# Patient Record
Sex: Male | Born: 1965 | Race: White | Hispanic: No | Marital: Married | Smoking: Current every day smoker
Health system: Southern US, Community
[De-identification: ages and names within clinical notes are randomized; demographics above are authoritative.]

## PROBLEM LIST (undated history)

## (undated) DIAGNOSIS — M1990 Unspecified osteoarthritis, unspecified site: Secondary | ICD-10-CM

## (undated) DIAGNOSIS — J45909 Unspecified asthma, uncomplicated: Secondary | ICD-10-CM

## (undated) DIAGNOSIS — G4730 Sleep apnea, unspecified: Secondary | ICD-10-CM

## (undated) DIAGNOSIS — I10 Essential (primary) hypertension: Secondary | ICD-10-CM

## (undated) DIAGNOSIS — M21371 Foot drop, right foot: Secondary | ICD-10-CM

## (undated) DIAGNOSIS — M5431 Sciatica, right side: Secondary | ICD-10-CM

## (undated) HISTORY — PX: MULTIPLE TOOTH EXTRACTIONS: SHX2053

## (undated) HISTORY — DX: Foot drop, right foot: M21.371

## (undated) HISTORY — PX: KNEE ARTHROSCOPY: SUR90

## (undated) HISTORY — PX: BACK SURGERY: SHX140

## (undated) HISTORY — DX: Sleep apnea, unspecified: G47.30

## (undated) HISTORY — PX: SPINE SURGERY: SHX786

## (undated) HISTORY — DX: Unspecified asthma, uncomplicated: J45.909

## (undated) HISTORY — DX: Essential (primary) hypertension: I10

## (undated) HISTORY — PX: ROTATOR CUFF REPAIR: SHX139

## (undated) HISTORY — PX: CERVICAL FUSION: SHX112

## (undated) HISTORY — PX: CARPAL TUNNEL RELEASE: SHX101

---

## 2016-01-20 ENCOUNTER — Ambulatory Visit (INDEPENDENT_AMBULATORY_CARE_PROVIDER_SITE_OTHER): Payer: Medicaid Other | Admitting: Family Medicine

## 2016-01-20 ENCOUNTER — Telehealth: Payer: Self-pay | Admitting: Family Medicine

## 2016-01-20 ENCOUNTER — Encounter: Payer: Self-pay | Admitting: Family Medicine

## 2016-01-20 VITALS — BP 152/91 | HR 94 | Temp 97.3°F | Ht 65.0 in | Wt 198.8 lb

## 2016-01-20 DIAGNOSIS — K219 Gastro-esophageal reflux disease without esophagitis: Secondary | ICD-10-CM | POA: Diagnosis not present

## 2016-01-20 DIAGNOSIS — G8929 Other chronic pain: Secondary | ICD-10-CM

## 2016-01-20 DIAGNOSIS — I10 Essential (primary) hypertension: Secondary | ICD-10-CM | POA: Diagnosis not present

## 2016-01-20 DIAGNOSIS — M545 Low back pain: Secondary | ICD-10-CM | POA: Diagnosis not present

## 2016-01-20 DIAGNOSIS — J45909 Unspecified asthma, uncomplicated: Secondary | ICD-10-CM | POA: Diagnosis not present

## 2016-01-20 DIAGNOSIS — G2581 Restless legs syndrome: Secondary | ICD-10-CM | POA: Diagnosis not present

## 2016-01-20 DIAGNOSIS — Z23 Encounter for immunization: Secondary | ICD-10-CM | POA: Diagnosis not present

## 2016-01-20 LAB — URINALYSIS, COMPLETE
Bilirubin, UA: NEGATIVE
GLUCOSE, UA: NEGATIVE
KETONES UA: NEGATIVE
Leukocytes, UA: NEGATIVE
NITRITE UA: NEGATIVE
PROTEIN UA: NEGATIVE
SPEC GRAV UA: 1.025 (ref 1.005–1.030)
Urobilinogen, Ur: 0.2 mg/dL (ref 0.2–1.0)
pH, UA: 6 (ref 5.0–7.5)

## 2016-01-20 LAB — MICROSCOPIC EXAMINATION
Bacteria, UA: NONE SEEN
Renal Epithel, UA: NONE SEEN /hpf

## 2016-01-20 MED ORDER — BUDESONIDE-FORMOTEROL FUMARATE 160-4.5 MCG/ACT IN AERO: 2.0000 | INHALATION_SPRAY | Freq: Two times a day (BID) | RESPIRATORY_TRACT | 3 refills | Status: DC

## 2016-01-20 MED ORDER — ROPINIROLE HCL 1 MG PO TABS: 1.0000 mg | ORAL_TABLET | Freq: Three times a day (TID) | ORAL | 2 refills | Status: DC

## 2016-01-20 MED ORDER — AMLODIPINE BESYLATE 5 MG PO TABS: 5.0000 mg | ORAL_TABLET | Freq: Every day | ORAL | 5 refills | Status: DC

## 2016-01-20 NOTE — Telephone Encounter (Signed)
Pharmacist need to know if RX should be for 90 days instead of 30  for rOPINIRole (REQUIP) 1 MG tablet - Take 1 tablet (1 mg total) by mouth 3 (three) times daily.  Also this is a new client.  Is this correct to send to [facility]?  Please call

## 2016-01-20 NOTE — Telephone Encounter (Signed)
Stu at [facility] has received all questions regarding this phone call

## 2016-01-20 NOTE — Progress Notes (Addendum)
Subjective:  Patient ID: Jay Ortega, male    DOB: [birth year]  Age: 50 y.o. MRN: [id number]  CC: New Patient (Initial Visit)   HPI Jay Ortega presents for  follow-up of hypertension. Patient has no history of headache chest pain or shortness of breath or recent cough. Patient also denies symptoms of TIA such as numbness weakness lateralizing. Patient denies side effects from his medication. States ran out several weeks ago. Patient also has asthma. He uses an inhaler periodically. Denies current symptoms including shortness of breath and wheezing.   He also has a history of back pain. Has had multiple back surgeries. Requests opiate prescription to tide him over until he can get to pain management.   History Cort has a past medical history of Asthma; Foot drop, right; Hypertension; and Sleep apnea.   He has a past surgical history that includes Spine surgery; Rotator cuff repair; Carpal tunnel release (Bilateral); and Knee arthroscopy (Left).   His family history includes Cancer in his father; Diabetes in his mother.He reports that he has been smoking Cigarettes.  He has never used smokeless tobacco. He reports that he does not drink alcohol or use drugs.    ROS Review of Systems  Constitutional: Negative for chills, diaphoresis, fever and unexpected weight change.  HENT: Negative for congestion, hearing loss, rhinorrhea and sore throat.   Eyes: Negative for visual disturbance.  Respiratory: Negative for cough and shortness of breath.   Cardiovascular: Negative for chest pain.  Gastrointestinal: Negative for abdominal pain, constipation and diarrhea.  Genitourinary: Negative for dysuria and flank pain.  Musculoskeletal: Positive for arthralgias and back pain. Negative for joint swelling.  Skin: Negative for rash.  Neurological: Negative for dizziness and headaches.  Psychiatric/Behavioral: Negative for dysphoric mood and sleep disturbance.    Objective:  BP (!) 152/91    Pulse 94   Temp 97.3 F (36.3 C) (Oral)   Ht 5' 5"  (1.651 m)   Wt 198 lb 12.8 oz (90.2 kg)   BMI 33.08 kg/m   BP Readings from Last 3 Encounters:  01/20/16 (!) 152/91    Wt Readings from Last 3 Encounters:  01/20/16 198 lb 12.8 oz (90.2 kg)     Physical Exam  Constitutional: He is oriented to person, place, and time. He appears well-developed and well-nourished. No distress.  HENT:  Head: Normocephalic and atraumatic.  Right Ear: External ear normal.  Left Ear: External ear normal.  Nose: Nose normal.  Mouth/Throat: Oropharynx is clear and moist.  Eyes: Conjunctivae and EOM are normal. Pupils are equal, round, and reactive to light.  Neck: Normal range of motion. Neck supple. No thyromegaly present.  Cardiovascular: Normal rate, regular rhythm and normal heart sounds.   No murmur heard. Pulmonary/Chest: Effort normal and breath sounds normal. No respiratory distress. He has no wheezes. He has no rales.  Abdominal: Soft. Bowel sounds are normal. He exhibits no distension. There is no tenderness.  Lymphadenopathy:    He has no cervical adenopathy.  Neurological: He is alert and oriented to person, place, and time. He has normal reflexes.  Skin: Skin is warm and dry.  Psychiatric: He has a normal mood and affect. His behavior is normal. Judgment and thought content normal.     Lab Results  Component Value Date   WBC 10.8 01/20/2016   HCT 48.4 01/20/2016   PLT 267 01/20/2016   GLUCOSE 96 01/20/2016   ALT 16 01/20/2016   AST 19 01/20/2016   NA 145 (H) 01/20/2016  K 4.9 01/20/2016   CL 102 01/20/2016   CREATININE 0.73 (L) 01/20/2016   BUN 12 01/20/2016   CO2 24 01/20/2016    Patient was never admitted.  Assessment & Plan:   Jay Ortega was seen today for new patient (initial visit).  Diagnoses and all orders for this visit:  Benign essential HTN -     CBC with Differential/Platelet -     CMP14+EGFR -     Urinalysis, Complete -     Ambulatory referral to Pain  Clinic -     amLODipine (NORVASC) 5 MG tablet; Take 1 tablet (5 mg total) by mouth daily. For blood pressure -     Microscopic Examination  Encounter for immunization -     Flu Vaccine QUAD 36+ mos IM  Chronic bilateral low back pain, with sciatica presence unspecified -     CBC with Differential/Platelet -     CMP14+EGFR -     Urinalysis, Complete -     Ambulatory referral to Pain Clinic  Moderate asthma without complication, unspecified whether persistent -     PR BREATHING CAPACITY TEST -     CBC with Differential/Platelet -     CMP14+EGFR -     Urinalysis, Complete -     Ambulatory referral to Pain Clinic -     budesonide-formoterol (SYMBICORT) 160-4.5 MCG/ACT inhaler; Inhale 2 puffs into the lungs 2 (two) times daily. For COPD/Asthma  Gastroesophageal reflux disease without esophagitis  Need for influenza vaccination -     Flu Vaccine QUAD 36+ mos IM  Restless leg syndrome -     rOPINIRole (REQUIP) 1 MG tablet; Take 1 tablet (1 mg total) by mouth 3 (three) times daily.    I am having Jay Ortega start on rOPINIRole, budesonide-formoterol, and amLODipine. I am also having him maintain his RANITIDINE HCL PO, ALBUTEROL IN, and albuterol.  Meds ordered this encounter  Medications  . RANITIDINE HCL PO    Sig: Take by mouth.  . ALBUTEROL IN    Sig: Inhale into the lungs.  Marland Kitchen albuterol (ACCUNEB) 1.25 MG/3ML nebulizer solution    Sig: Take 1 ampule by nebulization every 6 (six) hours as needed for wheezing.  Marland Kitchen rOPINIRole (REQUIP) 1 MG tablet    Sig: Take 1 tablet (1 mg total) by mouth 3 (three) times daily.    Dispense:  30 tablet    Refill:  2  . budesonide-formoterol (SYMBICORT) 160-4.5 MCG/ACT inhaler    Sig: Inhale 2 puffs into the lungs 2 (two) times daily. For COPD/Asthma    Dispense:  3 Inhaler    Refill:  3  . amLODipine (NORVASC) 5 MG tablet    Sig: Take 1 tablet (5 mg total) by mouth daily. For blood pressure    Dispense:  30 tablet    Refill:  5      Follow-up: Return in about 1 month (around 02/20/2016).  Claretta Fraise, M.D.

## 2016-01-21 LAB — CMP14+EGFR
ALBUMIN: 4.8 g/dL (ref 3.5–5.5)
ALT: 16 IU/L (ref 0–44)
AST: 19 IU/L (ref 0–40)
Albumin/Globulin Ratio: 1.8 (ref 1.2–2.2)
Alkaline Phosphatase: 73 IU/L (ref 39–117)
BUN / CREAT RATIO: 16 (ref 9–20)
BUN: 12 mg/dL (ref 6–24)
Bilirubin Total: 0.2 mg/dL (ref 0.0–1.2)
CALCIUM: 9.8 mg/dL (ref 8.7–10.2)
CO2: 24 mmol/L (ref 18–29)
CREATININE: 0.73 mg/dL — AB (ref 0.76–1.27)
Chloride: 102 mmol/L (ref 96–106)
GFR calc Af Amer: 123 mL/min/{1.73_m2} (ref >59)
GFR, EST NON AFRICAN AMERICAN: 107 mL/min/{1.73_m2} (ref >59)
GLOBULIN, TOTAL: 2.6 g/dL (ref 1.5–4.5)
GLUCOSE: 96 mg/dL (ref 65–99)
Potassium: 4.9 mmol/L (ref 3.5–5.2)
SODIUM: 145 mmol/L — AB (ref 134–144)
TOTAL PROTEIN: 7.4 g/dL (ref 6.0–8.5)

## 2016-01-21 LAB — CBC WITH DIFFERENTIAL/PLATELET
BASOS ABS: 0.1 10*3/uL (ref 0.0–0.2)
Basos: 1 %
EOS (ABSOLUTE): 0.5 10*3/uL — ABNORMAL HIGH (ref 0.0–0.4)
EOS: 5 %
HEMATOCRIT: 48.4 % (ref 37.5–51.0)
HEMOGLOBIN: 16.7 g/dL (ref 13.0–17.7)
IMMATURE GRANS (ABS): 0 10*3/uL (ref 0.0–0.1)
IMMATURE GRANULOCYTES: 0 %
LYMPHS ABS: 2.6 10*3/uL (ref 0.7–3.1)
LYMPHS: 24 %
MCH: 32.2 pg (ref 26.6–33.0)
MCHC: 34.5 g/dL (ref 31.5–35.7)
MCV: 93 fL (ref 79–97)
MONOCYTES: 10 %
Monocytes Absolute: 1 10*3/uL — ABNORMAL HIGH (ref 0.1–0.9)
NEUTROS PCT: 60 %
Neutrophils Absolute: 6.6 10*3/uL (ref 1.4–7.0)
Platelets: 267 10*3/uL (ref 150–379)
RBC: 5.19 x10E6/uL (ref 4.14–5.80)
RDW: 13.7 % (ref 12.3–15.4)
WBC: 10.8 10*3/uL (ref 3.4–10.8)

## 2016-01-22 ENCOUNTER — Encounter: Payer: Self-pay | Admitting: *Deleted

## 2016-01-22 DIAGNOSIS — J45909 Unspecified asthma, uncomplicated: Secondary | ICD-10-CM | POA: Insufficient documentation

## 2016-01-27 ENCOUNTER — Telehealth: Payer: Self-pay | Admitting: Family Medicine

## 2016-01-28 NOTE — Telephone Encounter (Signed)
We cant rush a pain  Referral   It has been made but could take up to 6 weeks

## 2016-01-28 NOTE — Telephone Encounter (Signed)
Pt given appt with Dr.Stacks 12/15 at 3:55.

## 2016-01-29 ENCOUNTER — Encounter (INDEPENDENT_AMBULATORY_CARE_PROVIDER_SITE_OTHER): Payer: Self-pay

## 2016-01-29 ENCOUNTER — Ambulatory Visit (INDEPENDENT_AMBULATORY_CARE_PROVIDER_SITE_OTHER): Payer: Medicaid Other | Admitting: Family Medicine

## 2016-01-29 ENCOUNTER — Encounter: Payer: Self-pay | Admitting: Family Medicine

## 2016-01-29 VITALS — BP 168/90 | HR 100 | Temp 97.4°F | Ht 65.0 in | Wt 200.0 lb

## 2016-01-29 DIAGNOSIS — G2581 Restless legs syndrome: Secondary | ICD-10-CM

## 2016-01-29 DIAGNOSIS — G8929 Other chronic pain: Secondary | ICD-10-CM

## 2016-01-29 DIAGNOSIS — M545 Low back pain: Secondary | ICD-10-CM | POA: Diagnosis not present

## 2016-01-29 DIAGNOSIS — F1120 Opioid dependence, uncomplicated: Secondary | ICD-10-CM

## 2016-01-29 MED ORDER — OXYCODONE HCL 10 MG PO TABS: 10.0000 mg | ORAL_TABLET | Freq: Three times a day (TID) | ORAL | 0 refills | Status: DC | PRN

## 2016-01-29 NOTE — Progress Notes (Signed)
Subjective:  Patient ID: Jay Ortega, male    DOB: 07/11/1965  Age: 50 y.o. MRN: [id number]  CC: Back Pain (pt here today for back pain, has referral for pain management but is in a lot of pain and can't wait until then, would like something for pain to hold him over)   HPI Jay Ortega presents for recheck of pain. Fell on ice 3 days ago.Exacerbating his pre-existing severe back pain. Of note is that the local pain clinic is now full and back up. They have not been able to give him exact time for an appointment within the next 6 weeks. He is in 8-10/10 pain at the midline lower back it is an intense ache.    History Jay Ortega has a past medical history of Asthma; Foot drop, right; Hypertension; and Sleep apnea.   He has a past surgical history that includes Spine surgery; Rotator cuff repair; Carpal tunnel release (Bilateral); and Knee arthroscopy (Left).   His family history includes Cancer in his father; Diabetes in his mother.He reports that he has been smoking Cigarettes.  He has never used smokeless tobacco. He reports that he does not drink alcohol or use drugs.    ROS Review of Systems  Constitutional: Negative for chills, diaphoresis and fever.  HENT: Negative for rhinorrhea and sore throat.   Respiratory: Negative for cough and shortness of breath.   Cardiovascular: Negative for chest pain.  Gastrointestinal: Negative for abdominal pain.  Musculoskeletal: Negative for arthralgias and myalgias.  Skin: Negative for rash.  Neurological: Negative for weakness and headaches.    Objective:  BP (!) 168/90 Comment: pt is in a lot of pain and had just walked back down the hal  Pulse 100   Temp 97.4 F (36.3 C) (Oral)   Ht 5\' 5"  (1.651 m)   Wt 200 lb (90.7 kg)   BMI 33.28 kg/m   BP Readings from Last 3 Encounters:  01/29/16 (!) 168/90  01/20/16 (!) 152/91    Wt Readings from Last 3 Encounters:  01/29/16 200 lb (90.7 kg)  01/20/16 198 lb 12.8 oz (90.2 kg)      Physical Exam  Constitutional: He is oriented to person, place, and time. He appears well-developed and well-nourished. He appears distressed.  HENT:  Head: Normocephalic and atraumatic.  Right Ear: Tympanic membrane and external ear normal. No decreased hearing is noted.  Left Ear: Tympanic membrane and external ear normal. No decreased hearing is noted.  Mouth/Throat: No oropharyngeal exudate or posterior oropharyngeal erythema.  Eyes: Pupils are equal, round, and reactive to light.  Neck: Normal range of motion. Neck supple.  Cardiovascular: Normal rate and regular rhythm.   No murmur heard. Pulmonary/Chest: Breath sounds normal. No respiratory distress.  Abdominal: Soft. Bowel sounds are normal. He exhibits no mass. There is no tenderness.  Musculoskeletal: He exhibits tenderness (midline lumbar).  Can not extend lumbar spine above 20 degrees. Cannot flex beyond 45  Neurological: He is alert and oriented to person, place, and time.  Skin: Skin is warm and dry.  Multiple midline lumbar surgical scars  Psychiatric: He has a normal mood and affect.  Vitals reviewed.    Lab Results  Component Value Date   WBC 10.8 01/20/2016   HCT 48.4 01/20/2016   PLT 267 01/20/2016   GLUCOSE 96 01/20/2016   ALT 16 01/20/2016   AST 19 01/20/2016   NA 145 (H) 01/20/2016   K 4.9 01/20/2016   CL 102 01/20/2016   CREATININE 0.73 (L)  01/20/2016   BUN 12 01/20/2016   CO2 24 01/20/2016    Patient was never admitted.  Assessment & Plan:   Jay Ortega was seen today for back pain.  Diagnoses and all orders for this visit:  Chronic bilateral low back pain, with sciatica presence unspecified  Uncomplicated opioid dependence (HCC) -     ToxASSURE Select 13 (MW), Urine  Restless leg syndrome  Other orders -     Oxycodone HCl 10 MG TABS; Take 1 tablet (10 mg total) by mouth 3 (three) times daily as needed (for severe pain only).   Allergies as of 01/29/2016      Reactions   Codeine  Shortness Of Breath   Hives   Other    Tomatoes cause fever blisters.      Medication List       Accurate as of 01/29/16  6:25 PM. Always use your most recent med list.          albuterol 1.25 MG/3ML nebulizer solution Commonly known as:  ACCUNEB Take 1 ampule by nebulization every 6 (six) hours as needed for wheezing.   ALBUTEROL IN Inhale into the lungs.   amLODipine 5 MG tablet Commonly known as:  NORVASC Take 1 tablet (5 mg total) by mouth daily. For blood pressure   budesonide-formoterol 160-4.5 MCG/ACT inhaler Commonly known as:  SYMBICORT Inhale 2 puffs into the lungs 2 (two) times daily. For COPD/Asthma   Oxycodone HCl 10 MG Tabs Take 1 tablet (10 mg total) by mouth 3 (three) times daily as needed (for severe pain only).   RANITIDINE HCL PO Take by mouth.   rOPINIRole 1 MG tablet Commonly known as:  REQUIP Take 1 tablet (1 mg total) by mouth 3 (three) times daily.      Due to the severity of this pain and the obvious surgical scars giving credence to his concerns about failed back, not to mention the known problem getting in the pain clinic I went ahead with a week's worth of oxycodone. He will take 10 mg 3 times a day. Should he pass his toxassure I will consider extending that until the time comes that he is except a pain clinic but no longer than 6-8 weeks at most. He has not been asked to sign a pain contract due to the short-term nature of this arrangement. Since [place] [facility] is not reassuring.  I am having Jay Ortega start on Oxycodone HCl. I am also having him maintain his RANITIDINE HCL PO, ALBUTEROL IN, albuterol, rOPINIRole, budesonide-formoterol, and amLODipine.  Meds ordered this encounter  Medications  . Oxycodone HCl 10 MG TABS    Sig: Take 1 tablet (10 mg total) by mouth 3 (three) times daily as needed (for severe pain only).    Dispense:  25  tablet    Refill:  0   MME 45  Follow-up: Return in about 1 week (around 02/05/2016).  Mechele ClaudeWarren Derrall Hicks, M.D.

## 2016-02-01 ENCOUNTER — Telehealth: Payer: Self-pay | Admitting: Family Medicine

## 2016-02-01 ENCOUNTER — Other Ambulatory Visit: Payer: Self-pay | Admitting: Family Medicine

## 2016-02-01 DIAGNOSIS — J45909 Unspecified asthma, uncomplicated: Secondary | ICD-10-CM

## 2016-02-01 MED ORDER — ALBUTEROL SULFATE 1.25 MG/3ML IN NEBU: 1.0000 | INHALATION_SOLUTION | Freq: Four times a day (QID) | RESPIRATORY_TRACT | 11 refills | Status: DC | PRN

## 2016-02-01 NOTE — Telephone Encounter (Signed)
Neurology referral for sleep study was made last week when he was here. Nebulizer and supplies ordered

## 2016-02-02 ENCOUNTER — Other Ambulatory Visit: Payer: Self-pay | Admitting: Family Medicine

## 2016-02-02 DIAGNOSIS — G4733 Obstructive sleep apnea (adult) (pediatric): Secondary | ICD-10-CM

## 2016-02-02 NOTE — Telephone Encounter (Signed)
Dr. Darlyn ReadStacks, there is a referral to pain management for this patient but I do not see a referral for a sleep study in the chart.  Please advise.

## 2016-02-05 ENCOUNTER — Encounter: Payer: Self-pay | Admitting: Family Medicine

## 2016-02-05 ENCOUNTER — Ambulatory Visit (INDEPENDENT_AMBULATORY_CARE_PROVIDER_SITE_OTHER): Payer: Medicaid Other | Admitting: Family Medicine

## 2016-02-05 VITALS — BP 161/111 | HR 102 | Ht 65.0 in | Wt 203.0 lb

## 2016-02-05 DIAGNOSIS — J45909 Unspecified asthma, uncomplicated: Secondary | ICD-10-CM | POA: Diagnosis not present

## 2016-02-05 DIAGNOSIS — G8929 Other chronic pain: Secondary | ICD-10-CM | POA: Diagnosis not present

## 2016-02-05 DIAGNOSIS — M545 Low back pain: Secondary | ICD-10-CM

## 2016-02-05 DIAGNOSIS — I10 Essential (primary) hypertension: Secondary | ICD-10-CM | POA: Diagnosis not present

## 2016-02-05 LAB — TOXASSURE SELECT 13 (MW), URINE

## 2016-02-05 MED ORDER — ALBUTEROL SULFATE 1.25 MG/3ML IN NEBU: 1.0000 | INHALATION_SOLUTION | Freq: Four times a day (QID) | RESPIRATORY_TRACT | 11 refills | Status: DC | PRN

## 2016-02-05 MED ORDER — AMLODIPINE BESYLATE 10 MG PO TABS: 10.0000 mg | ORAL_TABLET | Freq: Every day | ORAL | 5 refills | Status: DC

## 2016-02-05 MED ORDER — OXYCODONE HCL 10 MG PO TABS: 10.0000 mg | ORAL_TABLET | Freq: Three times a day (TID) | ORAL | 0 refills | Status: DC | PRN

## 2016-02-05 NOTE — Progress Notes (Signed)
Subjective:  Patient ID: Jay Ortega, male    DOB: 09/18/1965  Age: 50 y.o. MRN: [id number]  CC: Back Pain (pt here today for 1 week follow up on back pain)   HPI Jay Ortega presents for recheck of pain. Fell on ice 10 days ago.Exacerbating his pre-existing severe back pain. Stareting to [place]. Pain now 8/10, but reduced by med to 5/10. Toxassure collected last week. Results, not yrt available Of note is that the local pain clinic is now full and backed up. They have not  given him an appointment  pain at the midline lower back it is an intense ache.    History Jay Ortega has a past medical history of Asthma; Foot drop, right; Hypertension; and Sleep apnea.   He has a past surgical history that includes Spine surgery; Rotator cuff repair; Carpal tunnel release (Bilateral); and Knee arthroscopy (Left).   His family history includes Cancer in his father; Diabetes in his mother.He reports that he has been smoking Cigarettes.  He has never used smokeless tobacco. He reports that he does not drink alcohol or use drugs.    ROS Review of Systems  Constitutional: Negative for chills, diaphoresis and fever.  HENT: Negative for rhinorrhea and sore throat.   Respiratory: Negative for cough and shortness of breath.   Cardiovascular: Negative for chest pain.  Gastrointestinal: Negative for abdominal pain.  Musculoskeletal: Negative for arthralgias and myalgias.  Skin: Negative for rash.  Neurological: Negative for weakness and headaches.    Objective:  BP (!) 161/111   Pulse (!) 102   Ht 5\' 5"  (1.651 m)   Wt 203 lb (92.1 kg)   BMI 33.78 kg/m   BP Readings from Last 3 Encounters:  02/05/16 (!) 161/111  01/29/16 (!) 168/90  01/20/16 (!) 152/91    Wt Readings from Last 3 Encounters:  02/05/16 203 lb (92.1 kg)  01/29/16 200 lb (90.7 kg)  01/20/16 198 lb 12.8 oz (90.2 kg)     Physical Exam  Constitutional: He is oriented to person, place, and time. He appears well-developed and  well-nourished. He appears distressed.  HENT:  Head: Normocephalic and atraumatic.  Right Ear: Tympanic membrane and external ear normal. No decreased hearing is noted.  Left Ear: Tympanic membrane and external ear normal. No decreased hearing is noted.  Mouth/Throat: No oropharyngeal exudate or posterior oropharyngeal erythema.  Eyes: Pupils are equal, round, and reactive to light.  Neck: Normal range of motion. Neck supple.  Cardiovascular: Normal rate and regular rhythm.   No murmur heard. Pulmonary/Chest: Breath sounds normal. No respiratory distress.  Abdominal: Soft. Bowel sounds are normal. He exhibits no mass. There is no tenderness.  Musculoskeletal: He exhibits tenderness (midline lumbar).  Can not extend lumbar spine above 20 degrees. Cannot flex beyond 45  Neurological: He is alert and oriented to person, place, and time.  Skin: Skin is warm and dry.  Multiple midline lumbar surgical scars  Psychiatric: He has a normal mood and affect.  Vitals reviewed.    Lab Results  Component Value Date   WBC 10.8 01/20/2016   HCT 48.4 01/20/2016   PLT 267 01/20/2016   GLUCOSE 96 01/20/2016   ALT 16 01/20/2016   AST 19 01/20/2016   NA 145 (H) 01/20/2016   K 4.9 01/20/2016   CL 102 01/20/2016   CREATININE 0.73 (L) 01/20/2016   BUN 12 01/20/2016   CO2 24 01/20/2016    Patient was never admitted.  Assessment & Plan:   Jay Ortega was  seen today for back pain.  Diagnoses and all orders for this visit:  Benign essential HTN -     amLODipine (NORVASC) 10 MG tablet; Take 1 tablet (10 mg total) by mouth daily. For blood pressure  Chronic bilateral low back pain, with sciatica presence unspecified  Moderate asthma without complication, unspecified whether persistent -     albuterol (ACCUNEB) 1.25 MG/3ML nebulizer solution; Take 3 mLs (1.25 mg total) by nebulization every 6 (six) hours as needed for wheezing.  Other orders -     Oxycodone HCl 10 MG TABS; Take 1 tablet (10 mg  total) by mouth 3 (three) times daily as needed (for severe pain only).   Allergies as of 02/05/2016      Reactions   Codeine Shortness Of Breath   Hives   Other    Tomatoes cause fever blisters.      Medication List       Accurate as of 02/05/16  9:05 AM. Always use your most recent med list.          albuterol 1.25 MG/3ML nebulizer solution Commonly known as:  ACCUNEB Take 3 mLs (1.25 mg total) by nebulization every 6 (six) hours as needed for wheezing.   ALBUTEROL IN Inhale into the lungs.   amLODipine 10 MG tablet Commonly known as:  NORVASC Take 1 tablet (10 mg total) by mouth daily. For blood pressure   budesonide-formoterol 160-4.5 MCG/ACT inhaler Commonly known as:  SYMBICORT Inhale 2 puffs into the lungs 2 (two) times daily. For COPD/Asthma   Oxycodone HCl 10 MG Tabs Take 1 tablet (10 mg total) by mouth 3 (three) times daily as needed (for severe pain only).   RANITIDINE HCL PO Take by mouth.   rOPINIRole 1 MG tablet Commonly known as:  REQUIP Take 1 tablet (1 mg total) by mouth 3 (three) times daily.      Due to the severity of this pain and the obvious surgical scars giving credence to his concerns about failed back, not to mention the known problem getting in the pain clinic I went ahead with a week's worth of oxycodone. He will take 10 mg 3 times a day. Should he pass his toxassure I will consider extending that until the time comes that he is except a pain clinic but no longer than 6-8 weeks at most. He has not been asked to sign a pain contract due to the short-term nature of this arrangement. Since he recently moved here within the last 3 months from [place] the fact that the [facility] SRS was noncontributory is not reassuring.  I have changed [name] amLODipine. I am also having him maintain his RANITIDINE HCL PO, ALBUTEROL IN, rOPINIRole, budesonide-formoterol, albuterol, and Oxycodone HCl.  Meds ordered this encounter  Medications  .  albuterol (ACCUNEB) 1.25 MG/3ML nebulizer solution    Sig: Take 3 mLs (1.25 mg total) by nebulization every 6 (six) hours as needed for wheezing.    Dispense:  125 vial    Refill:  11  . amLODipine (NORVASC) 10 MG tablet    Sig: Take 1 tablet (10 mg total) by mouth daily. For blood pressure    Dispense:  30 tablet    Refill:  5    Replaces 5 mg scrip  . Oxycodone HCl 10 MG TABS    Sig: Take 1 tablet (10 mg total) by mouth 3 (three) times daily as needed (for severe pain only).    Dispense:  48 tablet    Refill:  0   MME 45  Follow-up: Return in 3 weeks (on 02/23/2016) for Pain, hypertension.  Mechele ClaudeWarren Barbara Keng, M.D.

## 2016-02-11 ENCOUNTER — Other Ambulatory Visit: Payer: Self-pay | Admitting: *Deleted

## 2016-02-11 DIAGNOSIS — I10 Essential (primary) hypertension: Secondary | ICD-10-CM

## 2016-02-11 DIAGNOSIS — J45909 Unspecified asthma, uncomplicated: Secondary | ICD-10-CM

## 2016-02-11 MED ORDER — AMLODIPINE BESYLATE 10 MG PO TABS: 10.0000 mg | ORAL_TABLET | Freq: Every day | ORAL | 5 refills | Status: DC

## 2016-02-11 MED ORDER — ALBUTEROL SULFATE 1.25 MG/3ML IN NEBU: 1.0000 | INHALATION_SOLUTION | Freq: Four times a day (QID) | RESPIRATORY_TRACT | 11 refills | Status: DC | PRN

## 2016-02-23 ENCOUNTER — Encounter: Payer: Self-pay | Admitting: Family Medicine

## 2016-02-23 ENCOUNTER — Ambulatory Visit (INDEPENDENT_AMBULATORY_CARE_PROVIDER_SITE_OTHER): Payer: Medicaid Other | Admitting: Family Medicine

## 2016-02-23 VITALS — BP 135/84 | HR 107 | Temp 98.0°F | Ht 65.0 in | Wt 204.0 lb

## 2016-02-23 DIAGNOSIS — M961 Postlaminectomy syndrome, not elsewhere classified: Secondary | ICD-10-CM | POA: Diagnosis not present

## 2016-02-23 DIAGNOSIS — I10 Essential (primary) hypertension: Secondary | ICD-10-CM

## 2016-02-23 DIAGNOSIS — F1120 Opioid dependence, uncomplicated: Secondary | ICD-10-CM | POA: Diagnosis not present

## 2016-02-23 DIAGNOSIS — M545 Low back pain, unspecified: Secondary | ICD-10-CM | POA: Insufficient documentation

## 2016-02-23 DIAGNOSIS — G2581 Restless legs syndrome: Secondary | ICD-10-CM | POA: Diagnosis not present

## 2016-02-23 DIAGNOSIS — G8929 Other chronic pain: Secondary | ICD-10-CM

## 2016-02-23 MED ORDER — OXYCODONE HCL 10 MG PO TABS: 10.0000 mg | ORAL_TABLET | Freq: Three times a day (TID) | ORAL | 0 refills | Status: DC | PRN

## 2016-02-23 NOTE — Progress Notes (Signed)
Subjective:  Patient ID: Jay Ortega, male    DOB: October 09, 1965  Age: 51 y.o. MRN: [id number]  CC: Hypertension (pt here today following up for HTN, he has his appt at the pain clinic on the 30th of this month and his sleep study on the 16th of this month.)   HPI Jay Ortega presents for  follow-up of hypertension. Patient has no history of headache chest pain or shortness of breath or recent cough. Patient also denies symptoms of TIA such as numbness weakness lateralizing. Patient denies side effects from his medication. States ran out several weeks ago. Patient also has asthma. He uses an inhaler periodically. Denies current symptoms including shortness of breath and wheezing.   He also has a history of back pain. Has had multiple back surgeries. Patient has appointment scheduled with pain clinic for January 30. Needs enough of his opiate to tide him over to [place]. Pain management to take over all. Prescriptions then. Records received an process of review but to reveal long-term use of opiates. These include fentanyl hydromorphone Opana duloxetine clonidine gabapentin Seroquel prednisone among others. He has been diagnosed with postlaminectomy syndrome in the lumbar region. History Jay Ortega has a past medical history of Asthma; Foot drop, right; Hypertension; and Sleep apnea.   He has a past surgical history that includes Spine surgery; Rotator cuff repair; Carpal tunnel release (Bilateral); and Knee arthroscopy (Left).   His family history includes Cancer in his father; Diabetes in his mother.He reports that he has been smoking Cigarettes.  He has never used smokeless tobacco. He reports that he does not drink alcohol or use drugs.    ROS Review of Systems  Constitutional: Negative for chills, diaphoresis, fever and unexpected weight change.  HENT: Negative for congestion, hearing loss, rhinorrhea and sore throat.   Eyes: Negative for visual disturbance.  Respiratory: Negative for cough and  shortness of breath.   Cardiovascular: Negative for chest pain.  Gastrointestinal: Negative for abdominal pain, constipation and diarrhea.  Genitourinary: Negative for dysuria and flank pain.  Musculoskeletal: Positive for arthralgias and back pain. Negative for joint swelling.  Skin: Negative for rash.  Neurological: Negative for dizziness and headaches.  Psychiatric/Behavioral: Negative for dysphoric mood and sleep disturbance.    Objective:  BP 135/84   Pulse (!) 107   Temp 98 F (36.7 C) (Oral)   Ht 5\' 5"  (1.651 m)   Wt 204 lb (92.5 kg)   BMI 33.95 kg/m   BP Readings from Last 3 Encounters:  02/23/16 135/84  02/05/16 (!) 161/111  01/29/16 (!) 168/90    Wt Readings from Last 3 Encounters:  02/23/16 204 lb (92.5 kg)  02/05/16 203 lb (92.1 kg)  01/29/16 200 lb (90.7 kg)     Physical Exam  Constitutional: He is oriented to person, place, and time. He appears well-developed and well-nourished. No distress.  HENT:  Head: Normocephalic and atraumatic.  Right Ear: External ear normal.  Left Ear: External ear normal.  Nose: Nose normal.  Mouth/Throat: Oropharynx is clear and moist.  Eyes: Conjunctivae and EOM are normal. Pupils are equal, round, and reactive to light.  Neck: Normal range of motion. Neck supple. No thyromegaly present.  Cardiovascular: Normal rate, regular rhythm and normal heart sounds.   No murmur heard. Pulmonary/Chest: Effort normal and breath sounds normal. No respiratory distress. He has no wheezes. He has no rales.  Abdominal: Soft. Bowel sounds are normal. He exhibits no distension. There is no tenderness.  Lymphadenopathy:    He  has no cervical adenopathy.  Neurological: He is alert and oriented to person, place, and time. He has normal reflexes.  Skin: Skin is warm and dry.  Psychiatric: He has a normal mood and affect. His behavior is normal. Judgment and thought content normal.     Lab Results  Component Value Date   WBC 10.8 01/20/2016     HCT 48.4 01/20/2016   PLT 267 01/20/2016   GLUCOSE 96 01/20/2016   ALT 16 01/20/2016   AST 19 01/20/2016   NA 145 (H) 01/20/2016   K 4.9 01/20/2016   CL 102 01/20/2016   CREATININE 0.73 (L) 01/20/2016   BUN 12 01/20/2016   CO2 24 01/20/2016    Patient was never admitted.  Assessment & Plan:   Jay Ortega was seen today for hypertension.  Diagnoses and all orders for this visit:  Benign essential HTN  Uncomplicated opioid dependence (HCC)  Chronic bilateral low back pain, with sciatica presence unspecified  Restless leg syndrome  Postlaminectomy syndrome, lumbar region  Other orders -     Oxycodone HCl 10 MG TABS; Take 1 tablet (10 mg total) by mouth 3 (three) times daily as needed (for severe pain only).   Will continue I am having Mr. Heron NayKunkle maintain his RANITIDINE HCL PO, ALBUTEROL IN, rOPINIRole, budesonide-formoterol, albuterol, amLODipine, and Oxycodone HCl.  Meds ordered this encounter  Medications  . Oxycodone HCl 10 MG TABS    Sig: Take 1 tablet (10 mg total) by mouth 3 (three) times daily as needed (for severe pain only).    Dispense:  63 tablet    Refill:  0   21 days of oxycodone 10 mg 3 times daily will be provided. Patient is aware that this is the last prescription from this office that he will receive.[facility] to evaluate the patient at that time. We'll continue to follow him for his other medical diagnoses including hypertension sleep apnea and asthma.  Follow-up: No Follow-up on file.  Mechele ClaudeWarren Vamsi Apfel, M.D.

## 2016-02-28 ENCOUNTER — Encounter: Payer: Self-pay | Admitting: Neurology

## 2016-03-01 ENCOUNTER — Encounter: Payer: Self-pay | Admitting: Neurology

## 2016-03-01 ENCOUNTER — Ambulatory Visit (INDEPENDENT_AMBULATORY_CARE_PROVIDER_SITE_OTHER): Payer: Medicaid Other | Admitting: Neurology

## 2016-03-01 VITALS — BP 124/60 | HR 82 | Resp 22 | Ht 65.0 in | Wt 210.0 lb

## 2016-03-01 DIAGNOSIS — F17200 Nicotine dependence, unspecified, uncomplicated: Secondary | ICD-10-CM | POA: Diagnosis not present

## 2016-03-01 DIAGNOSIS — R351 Nocturia: Secondary | ICD-10-CM

## 2016-03-01 DIAGNOSIS — E669 Obesity, unspecified: Secondary | ICD-10-CM

## 2016-03-01 DIAGNOSIS — G4733 Obstructive sleep apnea (adult) (pediatric): Secondary | ICD-10-CM | POA: Diagnosis not present

## 2016-03-01 DIAGNOSIS — F1190 Opioid use, unspecified, uncomplicated: Secondary | ICD-10-CM | POA: Diagnosis not present

## 2016-03-01 DIAGNOSIS — R519 Headache, unspecified: Secondary | ICD-10-CM

## 2016-03-01 DIAGNOSIS — G4730 Sleep apnea, unspecified: Secondary | ICD-10-CM

## 2016-03-01 DIAGNOSIS — J449 Chronic obstructive pulmonary disease, unspecified: Secondary | ICD-10-CM | POA: Diagnosis not present

## 2016-03-01 DIAGNOSIS — R51 Headache: Secondary | ICD-10-CM

## 2016-03-01 DIAGNOSIS — G4710 Hypersomnia, unspecified: Secondary | ICD-10-CM

## 2016-03-01 NOTE — Patient Instructions (Signed)
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
## 2016-03-09 ENCOUNTER — Ambulatory Visit (INDEPENDENT_AMBULATORY_CARE_PROVIDER_SITE_OTHER): Payer: Medicaid Other | Admitting: Neurology

## 2016-03-09 DIAGNOSIS — G4733 Obstructive sleep apnea (adult) (pediatric): Secondary | ICD-10-CM

## 2016-03-09 DIAGNOSIS — R0683 Snoring: Secondary | ICD-10-CM

## 2016-03-09 DIAGNOSIS — G4720 Circadian rhythm sleep disorder, unspecified type: Secondary | ICD-10-CM

## 2016-03-09 DIAGNOSIS — R0902 Hypoxemia: Secondary | ICD-10-CM
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
## 2016-03-11 NOTE — Progress Notes (Signed)
Patient referred by Dr. Darlyn ReadStacks, seen by me on 03/01/16, diagnostic PSG on 03/09/16.   Please call and notify the patient that the recent sleep study did not show any significant obstructive sleep apnea. He was noted to snore. He has lower oxygen saturations during sleep, mostly during REM  sleep. For this, CPAP therapy is not warranted. Recommendations are:  weight loss, smoking cessation, avoiding the supine sleep position, reduction of narcotic dose or discontinue narcotic pain medication, if possible and optimization of chronic lung disease  treatment. Patient may benefit from seeing a pulmonologist. Encouraged to discuss with PCP. Please inform patient that I we can go over the details of the study during a follow up appointment. Arrange a followup appointment. Also, route or fax report to PCP and referring MD, if other than PCP.  Once you have spoken to patient, you can close this encounter.   Thanks,  Huston FoleySaima Ewel Lona, MD, PhD [facility])

## 2016-03-14 ENCOUNTER — Telehealth: Payer: Self-pay

## 2016-03-14 NOTE — Telephone Encounter (Signed)
I spoke to patient and he is aware of results and recommendations. He voiced understanding. He did not want to make a f/u appt at this time. I will send records to PCP.

## 2016-03-14 NOTE — Telephone Encounter (Signed)
-----   Message from Huston FoleySaima Athar, MD sent at 03/11/2016 11:36 AM EST ----- Patient referred by Dr. Darlyn ReadStacks, seen by me on 03/01/16, diagnostic PSG on 03/09/16.   Please call and notify the patient that the recent sleep study did not show any significant obstructive sleep apnea. He was noted to snore. He has lower oxygen saturations during sleep, mostly during REM  sleep. For this, CPAP therapy is not warranted. Recommendations are:  weight loss, smoking cessation, avoiding the supine sleep position, reduction of narcotic dose or discontinue narcotic pain medication, if possible and optimization of chronic lung disease  treatment. Patient may benefit from seeing a pulmonologist. Encouraged to discuss with PCP. Please inform patient that I we can go over the details of the study during a follow up appointment. Arrange a followup appointment. Also, route or fax report to PCP and referring MD, if other than PCP.  Once you have spoken to patient, you can close this encounter.   Thanks,  Huston FoleySaima Athar, MD, PhD [facility])

## 2016-03-23 ENCOUNTER — Ambulatory Visit: Payer: Self-pay | Admitting: Physical Therapy

## 2016-03-30 ENCOUNTER — Telehealth: Payer: Self-pay | Admitting: Family Medicine

## 2016-03-30 NOTE — Telephone Encounter (Signed)
Can you please call them. It is a long story about him leaving the [facility] because he had benzos in his urine drug screen so they sent him to do counseling 3 times a week for 90 days but pt wouldn't do that so [facility] wouldn't see him. [place] but has been out of pain meds for 2 weeks and the wife was yelling at me and telling me no one is trying to help him.

## 2016-03-30 NOTE — Telephone Encounter (Signed)
Please contact the patient.  Unfortunately, if the pain clinic will not prescribe, I can not do so either. WS

## 2016-03-31 NOTE — Telephone Encounter (Signed)
Pt's wife notified of Dr Darlyn ReadStacks recommendation Also informed that Dr Darlyn ReadStacks will not RX pain med This was reiterated to pt's wife She still insisted on appt  appt scheduled

## 2016-04-05 ENCOUNTER — Ambulatory Visit: Payer: Medicaid Other | Admitting: Family Medicine

## 2016-04-06 ENCOUNTER — Encounter: Payer: Self-pay | Admitting: Family Medicine

## 2016-04-21 ENCOUNTER — Encounter: Payer: Self-pay | Admitting: Family Medicine

## 2016-04-22 ENCOUNTER — Telehealth: Payer: Self-pay | Admitting: *Deleted

## 2016-04-22 ENCOUNTER — Other Ambulatory Visit: Payer: Self-pay | Admitting: Family Medicine

## 2016-04-22 MED ORDER — ALBUTEROL SULFATE HFA 108 (90 BASE) MCG/ACT IN AERS: 2.0000 | INHALATION_SPRAY | Freq: Four times a day (QID) | RESPIRATORY_TRACT | 11 refills | Status: DC | PRN

## 2016-04-22 NOTE — Telephone Encounter (Signed)
Wants a Ventolin inhaler sent in to [facility]. [place]

## 2016-04-22 NOTE — Telephone Encounter (Signed)
I sent in the requested prescription 

## 2016-04-25 ENCOUNTER — Other Ambulatory Visit: Payer: Self-pay

## 2016-04-25 ENCOUNTER — Other Ambulatory Visit: Payer: Self-pay | Admitting: Family Medicine

## 2016-04-25 ENCOUNTER — Telehealth: Payer: Self-pay | Admitting: Pharmacist

## 2016-04-25 DIAGNOSIS — M21379 Foot drop, unspecified foot: Secondary | ICD-10-CM

## 2016-04-25 DIAGNOSIS — J45909 Unspecified asthma, uncomplicated: Secondary | ICD-10-CM

## 2016-04-25 DIAGNOSIS — R2689 Other abnormalities of gait and mobility: Principal | ICD-10-CM

## 2016-04-25 MED ORDER — NEBULIZER/TUBING/MOUTHPIECE KIT: PACK | 11 refills | Status: DC

## 2016-04-25 MED ORDER — ALBUTEROL SULFATE HFA 108 (90 BASE) MCG/ACT IN AERS: 2.0000 | INHALATION_SPRAY | Freq: Four times a day (QID) | RESPIRATORY_TRACT | 11 refills | Status: DC | PRN

## 2016-04-25 MED ORDER — ALBUTEROL SULFATE 1.25 MG/3ML IN NEBU: 1.0000 | INHALATION_SOLUTION | Freq: Four times a day (QID) | RESPIRATORY_TRACT | 11 refills | Status: DC | PRN

## 2016-04-25 NOTE — Telephone Encounter (Signed)
Please refer as requested for foot drop

## 2016-04-25 NOTE — Telephone Encounter (Signed)
Aware inhaler sent to pharmacy.  Is wanting to know about seeing Neurosurgeon. Drop foot. Has had 12 back surgeries. Has had increase pain & decrease function.

## 2016-04-25 NOTE — Telephone Encounter (Signed)
Reviewed Rx records.  Rx was sent to [facility] and patient [facility] be [facility].  [facility] resent to [facility] and they will cancel Rx that was sent to [facility].

## 2016-04-28 ENCOUNTER — Encounter: Payer: Self-pay | Admitting: Family Medicine

## 2016-05-18 ENCOUNTER — Encounter: Payer: Self-pay | Admitting: Family Medicine

## 2016-05-18 ENCOUNTER — Other Ambulatory Visit: Payer: Self-pay | Admitting: Family Medicine

## 2016-05-18 DIAGNOSIS — G8929 Other chronic pain: Secondary | ICD-10-CM

## 2016-05-18 DIAGNOSIS — M545 Low back pain: Secondary | ICD-10-CM

## 2016-05-18 DIAGNOSIS — M961 Postlaminectomy syndrome, not elsewhere classified: Secondary | ICD-10-CM

## 2016-05-18 NOTE — Telephone Encounter (Signed)
Spoke with patient's wife, who sent patient email.  Advised her that patient had been referred to [facility], and they will not see patient without a recent MRI.  Patient's wife states his last MRI was before his last back surgery which was 08/19/15 and she has copies of all recent imaging results.  Asked patient's wife to please bring these records so that we may make copies for his medical record.  New MRI ordered by Dr. Darlyn Read and was sent to clinical review for Medicaid.  Patient has an appointment at [facility] tomorrow in [place] who Dr. Darlyn Read referred him to, and a follow up appointment with Dr. Darlyn Read Monday 05/23/16.

## 2016-05-23 ENCOUNTER — Encounter: Payer: Self-pay | Admitting: Family Medicine

## 2016-05-23 ENCOUNTER — Ambulatory Visit (INDEPENDENT_AMBULATORY_CARE_PROVIDER_SITE_OTHER): Payer: Medicaid Other | Admitting: Family Medicine

## 2016-05-23 VITALS — BP 116/70 | HR 84 | Ht 65.0 in | Wt 218.0 lb

## 2016-05-23 DIAGNOSIS — G4734 Idiopathic sleep related nonobstructive alveolar hypoventilation: Secondary | ICD-10-CM

## 2016-05-23 MED ORDER — PRAMIPEXOLE DIHYDROCHLORIDE 0.125 MG PO TABS: ORAL_TABLET | ORAL | 0 refills | Status: DC

## 2016-05-23 MED ORDER — KETOCONAZOLE 2 % EX SHAM: 1.0000 "application " | MEDICATED_SHAMPOO | CUTANEOUS | 11 refills | Status: DC

## 2016-05-23 MED ORDER — ECONAZOLE NITRATE 1 % EX CREA: TOPICAL_CREAM | Freq: Every day | CUTANEOUS | 5 refills | Status: DC

## 2016-05-23 MED ORDER — MIRTAZAPINE 30 MG PO TABS: 30.0000 mg | ORAL_TABLET | Freq: Every day | ORAL | 2 refills | Status: DC

## 2016-05-23 MED ORDER — PREDNISONE 10 MG PO TABS: ORAL_TABLET | ORAL | 0 refills | Status: DC

## 2016-05-23 MED ORDER — CLONIDINE HCL 0.1 MG PO TABS: 0.1000 mg | ORAL_TABLET | Freq: Two times a day (BID) | ORAL | 5 refills | Status: DC

## 2016-05-23 NOTE — Progress Notes (Signed)
Subjective:  Patient ID: Jay Ortega, male    DOB: Jul 14, 1965  Age: 51 y.o. MRN: [id number]  CC: Hypertension (pt here today for follow up on HTN and he also stopped taking his Norvasc as it was causing stomach problems. He would like to go back on  Clonidine. )   HPI Jay Ortega presents for  follow-up of hypertension. Patient has no history of headache chest pain or shortness of breath or recent cough. Patient also denies symptoms of TIA such as numbness weakness lateralizing. Patient reports significant side effects from his medication. Amlodipine caused nausea. He discontinued medication. He requests return to clonidine. He says he tolerated that quite well in the past.  Patient had sleep study done recently. His wife states that it was reported as negative but his oxygen dropped into the low range on several occasions during the night. Of note is that she is not sleeping well. He wakes up every 1-2 hours all night long. He is reporting exhaustion. He states that the leg movements at night or getting worse. Because of that he has discontinued the ropinirole which she says keeps him awake. Patient saw Dr. Emmaline Kluver of pain management last week. He brought his records along with an MRI disks to see if those will be useful and appealing denial for his referral to neurosurgery. He continues to have significant back pain.  History Jay Ortega has a past medical history of Asthma; Foot drop, right; Hypertension; and Sleep apnea.   He has a past surgical history that includes Spine surgery; Rotator cuff repair; Carpal tunnel release (Bilateral); Knee arthroscopy (Left); and Cervical fusion.   His family history includes Cancer in his father; Diabetes in his mother.He reports that he has been smoking Cigarettes.  He has never used smokeless tobacco. He reports that he does not drink alcohol or use drugs.  Current Outpatient Prescriptions on File Prior to Visit  Medication Sig Dispense Refill  . albuterol  (ACCUNEB) 1.25 MG/3ML nebulizer solution Take 3 mLs (1.25 mg total) by nebulization every 6 (six) hours as needed for wheezing. 125 vial 11  . albuterol (VENTOLIN HFA) 108 (90 Base) MCG/ACT inhaler Inhale 2 puffs into the lungs every 6 (six) hours as needed for wheezing or shortness of breath. 1 Inhaler 11  . budesonide-formoterol (SYMBICORT) 160-4.5 MCG/ACT inhaler Inhale 2 puffs into the lungs 2 (two) times daily. For COPD/Asthma 3 Inhaler 3  . Oxycodone HCl 10 MG TABS Take 1 tablet (10 mg total) by mouth 3 (three) times daily as needed (for severe pain only). 63 tablet 0  . RANITIDINE HCL PO Take by mouth.    . Respiratory Therapy Supplies (NEBULIZER/TUBING/MOUTHPIECE) KIT Use with nebulizer QID 30 each 11   No current facility-administered medications on file prior to visit.     ROS Review of Systems  Constitutional: Negative for chills, diaphoresis, fever and unexpected weight change.  HENT: Negative for congestion, hearing loss, rhinorrhea and sore throat.   Eyes: Negative for visual disturbance.  Respiratory: Negative for cough and shortness of breath.   Cardiovascular: Negative for chest pain.  Gastrointestinal: Negative for abdominal pain, constipation and diarrhea.  Genitourinary: Negative for dysuria and flank pain.  Musculoskeletal: Positive for arthralgias and back pain. Negative for joint swelling.  Skin: Positive for rash (rash occurs intermittently on the chest for many years.).  Neurological: Negative for dizziness and headaches.  Psychiatric/Behavioral: Negative for dysphoric mood and sleep disturbance.    Objective:  BP 116/70   Pulse 84  Ht 5' 5"  (1.651 m)   Wt 218 lb (98.9 kg)   BMI 36.28 kg/m   Physical Exam  Constitutional: He is oriented to person, place, and time. He appears well-developed and well-nourished. No distress.  HENT:  Head: Normocephalic and atraumatic.  Right Ear: External ear normal.  Left Ear: External ear normal.  Nose: Nose normal.    Mouth/Throat: Oropharynx is clear and moist.  Eyes: Conjunctivae and EOM are normal. Pupils are equal, round, and reactive to light.  Neck: Normal range of motion. Neck supple. No thyromegaly present.  Cardiovascular: Normal rate, regular rhythm and normal heart sounds.   No murmur heard. Pulmonary/Chest: Effort normal and breath sounds normal. No respiratory distress. He has no wheezes. He has no rales.  Abdominal: Soft. Bowel sounds are normal. He exhibits no distension. There is no tenderness.  Lymphadenopathy:    He has no cervical adenopathy.  Neurological: He is alert and oriented to person, place, and time. He has normal reflexes.  Skin: Skin is warm and dry. Rash (papular erythema with satellites over the central chest overlying the sternum) noted.  Psychiatric: He has a normal mood and affect. His behavior is normal. Judgment and thought content normal.    Assessment & Plan:   Jay Ortega was seen today for hypertension.  Diagnoses and all orders for this visit:  Nocturnal hypoxemia -     Pulse oximetry, overnight; Future  Other orders -     Discontinue: cloNIDine (CATAPRES) 0.1 MG tablet; Take 1 tablet (0.1 mg total) by mouth 2 (two) times daily. For Blood Pressure -     Discontinue: predniSONE (DELTASONE) 10 MG tablet; Take 5 daily for 2 days followed by 4,3,2 and 1 for 2 days each. -     Discontinue: mirtazapine (REMERON) 30 MG tablet; Take 1 tablet (30 mg total) by mouth at bedtime. For mood, sleep -     pramipexole (MIRAPEX) 0.125 MG tablet; 1 three times daily for 1 wk, then 2 TID for a week, then 3 TID for leg cramps -     Discontinue: ketoconazole (NIZORAL) 2 % shampoo; Apply 1 application topically 3 (three) times a week. Shampoo in as usual, allow to stay on scalp for 5 min. Before rinsing -     Discontinue: econazole nitrate 1 % cream; Apply topically daily. -     cloNIDine (CATAPRES) 0.1 MG tablet; Take 1 tablet (0.1 mg total) by mouth 2 (two) times daily. For Blood  Pressure -     econazole nitrate 1 % cream; Apply topically daily. -     ketoconazole (NIZORAL) 2 % shampoo; Apply 1 application topically 3 (three) times a week. Shampoo in as usual, allow to stay on scalp for 5 min. Before rinsing -     mirtazapine (REMERON) 30 MG tablet; Take 1 tablet (30 mg total) by mouth at bedtime. For mood, sleep -     predniSONE (DELTASONE) 10 MG tablet; Take 5 daily for 2 days followed by 4,3,2 and 1 for 2 days each.   I have discontinued Jay Ortega rOPINIRole and amLODipine. I am also having him start on pramipexole. Additionally, I am having him maintain his RANITIDINE HCL PO, budesonide-formoterol, Oxycodone HCl, Nebulizer/Tubing/Mouthpiece, albuterol, albuterol, cloNIDine, econazole nitrate, ketoconazole, mirtazapine, and predniSONE.  Meds ordered this encounter  Medications  . DISCONTD: cloNIDine (CATAPRES) 0.1 MG tablet    Sig: Take 1 tablet (0.1 mg total) by mouth 2 (two) times daily. For Blood Pressure    Dispense:  60 tablet  Refill:  5  . DISCONTD: predniSONE (DELTASONE) 10 MG tablet    Sig: Take 5 daily for 2 days followed by 4,3,2 and 1 for 2 days each.    Dispense:  30 tablet    Refill:  0  . DISCONTD: mirtazapine (REMERON) 30 MG tablet    Sig: Take 1 tablet (30 mg total) by mouth at bedtime. For mood, sleep    Dispense:  30 tablet    Refill:  2  . pramipexole (MIRAPEX) 0.125 MG tablet    Sig: 1 three times daily for 1 wk, then 2 TID for a week, then 3 TID for leg cramps    Dispense:  270 tablet    Refill:  0  . DISCONTD: ketoconazole (NIZORAL) 2 % shampoo    Sig: Apply 1 application topically 3 (three) times a week. Shampoo in as usual, allow to stay on scalp for 5 min. Before rinsing    Dispense:  120 mL    Refill:  11  . DISCONTD: econazole nitrate 1 % cream    Sig: Apply topically daily.    Dispense:  85 g    Refill:  5  . cloNIDine (CATAPRES) 0.1 MG tablet    Sig: Take 1 tablet (0.1 mg total) by mouth 2 (two) times daily. For Blood  Pressure    Dispense:  60 tablet    Refill:  5  . econazole nitrate 1 % cream    Sig: Apply topically daily.    Dispense:  85 g    Refill:  5  . ketoconazole (NIZORAL) 2 % shampoo    Sig: Apply 1 application topically 3 (three) times a week. Shampoo in as usual, allow to stay on scalp for 5 min. Before rinsing    Dispense:  120 mL    Refill:  11  . mirtazapine (REMERON) 30 MG tablet    Sig: Take 1 tablet (30 mg total) by mouth at bedtime. For mood, sleep    Dispense:  30 tablet    Refill:  2  . predniSONE (DELTASONE) 10 MG tablet    Sig: Take 5 daily for 2 days followed by 4,3,2 and 1 for 2 days each.    Dispense:  30 tablet    Refill:  0   Referral to neurosurgery has been reopened. This due to bringing in an MRI and it is less than-year-old. Additionally he was told it was today specifically because there was not a recent MRI. Subsequently the MRI was denied by Medicaid. Patient was counseled in sleep hygiene. Due to patient's stated past experience with clonidine that medicine reassuringly. Due to recurrent insomnia mirtazapine was started for sleep. Based on his history of restless leg syndrome and intolerance of ropinirole, Mirapex was started. See above. Based on patient's tinea corporis infection, econazole was recommended and prescribed. His ketoconazole shampoo was renewed simpler scalp symptoms that have responded to similar shampoo in the past. He will be obtaining his opiates through pain clinic now that he has seen Dr. Emmaline Kluver. ollow-up: Return in about 6 weeks (around 07/04/2016).  Claretta Fraise, M.D.

## 2016-05-24 ENCOUNTER — Telehealth: Payer: Self-pay

## 2016-05-24 ENCOUNTER — Other Ambulatory Visit: Payer: Self-pay

## 2016-05-24 ENCOUNTER — Encounter: Payer: Self-pay | Admitting: Family Medicine

## 2016-05-24 MED ORDER — PRAMIPEXOLE DIHYDROCHLORIDE 0.125 MG PO TABS: ORAL_TABLET | ORAL | 0 refills | Status: DC

## 2016-05-24 MED ORDER — KETOCONAZOLE 2 % EX CREA: 1.0000 "application " | TOPICAL_CREAM | Freq: Two times a day (BID) | CUTANEOUS | 5 refills | Status: DC

## 2016-05-24 NOTE — Telephone Encounter (Signed)
I sent in the requested prescription 

## 2016-05-25 NOTE — Telephone Encounter (Signed)
Patient aware.

## 2016-05-30 ENCOUNTER — Ambulatory Visit (HOSPITAL_COMMUNITY)
Admission: RE | Admit: 2016-05-30 | Discharge: 2016-05-30 | Disposition: A | Discharge status: Home / Self Care | Payer: Medicaid Other | Source: Ambulatory Visit | Attending: Family Medicine | Admitting: Family Medicine

## 2016-05-30 DIAGNOSIS — M4316 Spondylolisthesis, lumbar region: Secondary | ICD-10-CM | POA: Diagnosis not present

## 2016-05-30 DIAGNOSIS — G8929 Other chronic pain: Secondary | ICD-10-CM | POA: Diagnosis present

## 2016-05-30 DIAGNOSIS — M5136 Other intervertebral disc degeneration, lumbar region: Secondary | ICD-10-CM | POA: Insufficient documentation

## 2016-05-30 DIAGNOSIS — M5440 Lumbago with sciatica, unspecified side: Secondary | ICD-10-CM | POA: Diagnosis not present

## 2016-05-30 DIAGNOSIS — Z9889 Other specified postprocedural states: Secondary | ICD-10-CM | POA: Diagnosis not present

## 2016-05-30 DIAGNOSIS — M1288 Other specific arthropathies, not elsewhere classified, other specified site: Secondary | ICD-10-CM | POA: Insufficient documentation

## 2016-05-30 DIAGNOSIS — M5124 Other intervertebral disc displacement, thoracic region: Secondary | ICD-10-CM | POA: Insufficient documentation

## 2016-05-30 DIAGNOSIS — M5137 Other intervertebral disc degeneration, lumbosacral region: Secondary | ICD-10-CM | POA: Insufficient documentation

## 2016-05-30 DIAGNOSIS — M48061 Spinal stenosis, lumbar region without neurogenic claudication: Secondary | ICD-10-CM | POA: Insufficient documentation

## 2016-05-30 DIAGNOSIS — M5127 Other intervertebral disc displacement, lumbosacral region: Secondary | ICD-10-CM | POA: Diagnosis not present

## 2016-05-30 DIAGNOSIS — M5126 Other intervertebral disc displacement, lumbar region: Secondary | ICD-10-CM | POA: Diagnosis not present

## 2016-05-30 DIAGNOSIS — M961 Postlaminectomy syndrome, not elsewhere classified: Secondary | ICD-10-CM | POA: Diagnosis not present

## 2016-05-30 DIAGNOSIS — M4186 Other forms of scoliosis, lumbar region: Secondary | ICD-10-CM | POA: Insufficient documentation

## 2016-05-30 DIAGNOSIS — M545 Low back pain: Secondary | ICD-10-CM

## 2016-06-14 ENCOUNTER — Telehealth: Payer: Self-pay | Admitting: Family Medicine

## 2016-06-15 NOTE — Telephone Encounter (Signed)
Records sent

## 2016-06-29 ENCOUNTER — Ambulatory Visit: Payer: Medicaid Other | Admitting: Family Medicine

## 2016-06-30 ENCOUNTER — Telehealth: Payer: Self-pay | Admitting: Family Medicine

## 2016-06-30 ENCOUNTER — Encounter: Payer: Self-pay | Admitting: Family Medicine

## 2016-06-30 NOTE — Telephone Encounter (Signed)
Pt forgot about appt yesterday. He rescheduled for 5/22 at 9:55 with Dr Darlyn ReadStacks.

## 2016-07-05 ENCOUNTER — Encounter: Payer: Self-pay | Admitting: Family Medicine

## 2016-07-05 ENCOUNTER — Ambulatory Visit (INDEPENDENT_AMBULATORY_CARE_PROVIDER_SITE_OTHER): Payer: Medicaid Other | Admitting: Family Medicine

## 2016-07-05 VITALS — BP 142/93 | HR 113 | Temp 97.2°F | Ht 65.0 in | Wt 204.0 lb

## 2016-07-05 DIAGNOSIS — G4734 Idiopathic sleep related nonobstructive alveolar hypoventilation: Secondary | ICD-10-CM | POA: Diagnosis not present

## 2016-07-05 DIAGNOSIS — G2581 Restless legs syndrome: Secondary | ICD-10-CM | POA: Diagnosis not present

## 2016-07-05 DIAGNOSIS — F5101 Primary insomnia: Secondary | ICD-10-CM

## 2016-07-05 DIAGNOSIS — I10 Essential (primary) hypertension: Secondary | ICD-10-CM | POA: Diagnosis not present

## 2016-07-05 DIAGNOSIS — M545 Low back pain: Secondary | ICD-10-CM | POA: Diagnosis not present

## 2016-07-05 DIAGNOSIS — G8929 Other chronic pain: Secondary | ICD-10-CM

## 2016-07-05 NOTE — Progress Notes (Signed)
Subjective:  Patient ID: Jay Ortega, male    DOB: 12-13-1965  Age: 51 y.o. MRN: [id number]  CC: Hypertension (pt here today for a 4 week recheck after changing BP medicine back to clonidine)   HPI Jay Ortega presents for Using his Mirapex as needed with some relief but still having some problems with cramping. Does not understand it as a scheduled medicine. He is sleeping much better since starting the Remeron. Getting about 6 hours a night which is better than he's had for a long time. He did not hear anything about an overnight pulse oximetry. Did not know he was supposed to be evaluated for oxygen at home. However he had a sleep study showing hypoxemia. The blood pressure is doing better on the clonidine. He has had no complaints with regard to side effects. He is readings at homes are better than his reading here. He is concerned bit about the reading here not being representative. Now connected with pain clinic, Dr. Angelique Blonder regarding his back pain. He will continue to get medications from him. History Jay Ortega has a past medical history of Asthma; Foot drop, right; Hypertension; and Sleep apnea.   He has a past surgical history that includes Spine surgery; Rotator cuff repair; Carpal tunnel release (Bilateral); Knee arthroscopy (Left); and Cervical fusion.   His family history includes Cancer in his father; Diabetes in his mother.He reports that he has been smoking Cigarettes.  He has never used smokeless tobacco. He reports that he does not drink alcohol or use drugs.    ROS Review of Systems  Constitutional: Negative for chills, diaphoresis and fever.  HENT: Negative for rhinorrhea and sore throat.   Respiratory: Negative for cough and shortness of breath.   Cardiovascular: Negative for chest pain.  Gastrointestinal: Negative for abdominal pain.  Musculoskeletal: Negative for arthralgias and myalgias.  Skin: Negative for rash.  Neurological: Negative for weakness and headaches.     Objective:  BP (!) 142/93   Pulse (!) 113   Temp 97.2 F (36.2 C) (Oral)   Ht _0  (1.651 m)   Wt 204 lb (92.5 kg)   BMI 33.95 kg/m   BP Readings from Last 3 Encounters:  07/05/16 (!) 142/93  05/23/16 116/70  03/01/16 124/60    Wt Readings from Last 3 Encounters:  07/05/16 204 lb (92.5 kg)  05/23/16 218 lb (98.9 kg)  03/01/16 210 lb (95.3 kg)     Physical Exam  Constitutional: He is oriented to person, place, and time. He appears well-developed and well-nourished. No distress.  HENT:  Head: Normocephalic and atraumatic.  Right Ear: External ear normal.  Left Ear: External ear normal.  Nose: Nose normal.  Mouth/Throat: Oropharynx is clear and moist.  Eyes: Conjunctivae and EOM are normal. Pupils are equal, round, and reactive to light.  Neck: Normal range of motion. Neck supple. No thyromegaly present.  Cardiovascular: Normal rate, regular rhythm and normal heart sounds.   No murmur heard. Pulmonary/Chest: Effort normal and breath sounds normal. No respiratory distress. He has no wheezes. He has no rales.  Abdominal: Soft. Bowel sounds are normal. He exhibits no distension. There is no tenderness.  Lymphadenopathy:    He has no cervical adenopathy.  Neurological: He is alert and oriented to person, place, and time. He has normal reflexes.  Skin: Skin is warm and dry.  Psychiatric: He has a normal mood and affect. His behavior is normal. Judgment and thought content normal.      Assessment & Plan:  Jay Ortega was seen today for hypertension.  Diagnoses and all orders for this visit:  Benign essential HTN  Chronic bilateral low back pain, with sciatica presence unspecified  Restless leg syndrome  Nocturnal hypoxia -     Pulse oximetry, overnight; Future  Primary insomnia       I have discontinued [name] predniSONE. I am also having him maintain his RANITIDINE HCL PO, budesonide-formoterol, Oxycodone HCl, Nebulizer/Tubing/Mouthpiece, albuterol,  albuterol, cloNIDine, ketoconazole, mirtazapine, pramipexole, and ketoconazole.  Allergies as of 07/05/2016      Reactions   Codeine Shortness Of Breath   Hives   Other    Tomatoes cause fever blisters.      Medication List       Accurate as of 07/05/16  6:19 PM. Always use your most recent med list.          albuterol 1.25 MG/3ML nebulizer solution Commonly known as:  ACCUNEB Take 3 mLs (1.25 mg total) by nebulization every 6 (six) hours as needed for wheezing.   albuterol 108 (90 Base) MCG/ACT inhaler Commonly known as:  VENTOLIN HFA Inhale 2 puffs into the lungs every 6 (six) hours as needed for wheezing or shortness of breath.   budesonide-formoterol 160-4.5 MCG/ACT inhaler Commonly known as:  SYMBICORT Inhale 2 puffs into the lungs 2 (two) times daily. For COPD/Asthma   cloNIDine 0.1 MG tablet Commonly known as:  CATAPRES Take 1 tablet (0.1 mg total) by mouth 2 (two) times daily. For Blood Pressure   ketoconazole 2 % shampoo Commonly known as:  NIZORAL Apply 1 application topically 3 (three) times a week. Shampoo in as usual, allow to stay on scalp for 5 min. Before rinsing   ketoconazole 2 % cream Commonly known as:  NIZORAL Apply 1 application topically 2 (two) times daily. For rash   mirtazapine 30 MG tablet Commonly known as:  REMERON Take 1 tablet (30 mg total) by mouth at bedtime. For mood, sleep   Nebulizer/Tubing/Mouthpiece Kit Use with nebulizer QID   Oxycodone HCl 10 MG Tabs Take 1 tablet (10 mg total) by mouth 3 (three) times daily as needed (for severe pain only).   pramipexole 0.125 MG tablet Commonly known as:  MIRAPEX 1 three times daily for 1 wk, then 2 TID for a week, then 3 TID for leg cramps   RANITIDINE HCL PO Take by mouth.        Follow-up: Return in about 3 months (around 10/05/2016).  Claretta Fraise, M.D.

## 2016-07-18 ENCOUNTER — Other Ambulatory Visit: Payer: Self-pay | Admitting: Neurosurgery

## 2016-07-22 ENCOUNTER — Encounter: Payer: Self-pay | Admitting: Family Medicine

## 2016-08-01 ENCOUNTER — Encounter: Payer: Self-pay | Admitting: Family Medicine

## 2016-08-02 ENCOUNTER — Other Ambulatory Visit: Payer: Self-pay | Admitting: *Deleted

## 2016-08-02 ENCOUNTER — Encounter: Payer: Self-pay | Admitting: Family Medicine

## 2016-08-02 DIAGNOSIS — G4734 Idiopathic sleep related nonobstructive alveolar hypoventilation: Secondary | ICD-10-CM

## 2016-08-19 ENCOUNTER — Other Ambulatory Visit: Payer: Self-pay | Admitting: Family Medicine

## 2016-08-24 ENCOUNTER — Encounter: Payer: Self-pay | Admitting: Neurology

## 2016-08-30 ENCOUNTER — Encounter: Payer: Self-pay | Admitting: Family Medicine

## 2016-09-08 ENCOUNTER — Encounter: Payer: Self-pay | Admitting: Family Medicine

## 2016-09-14 ENCOUNTER — Encounter: Payer: Self-pay | Admitting: Family Medicine

## 2016-09-19 ENCOUNTER — Other Ambulatory Visit: Payer: Self-pay | Admitting: Family Medicine

## 2016-09-19 ENCOUNTER — Ambulatory Visit: Payer: Medicaid Other | Admitting: Pediatrics

## 2016-09-20 ENCOUNTER — Other Ambulatory Visit: Payer: Self-pay | Admitting: *Deleted

## 2016-09-20 DIAGNOSIS — G4734 Idiopathic sleep related nonobstructive alveolar hypoventilation: Secondary | ICD-10-CM

## 2016-09-21 ENCOUNTER — Encounter: Payer: Self-pay | Admitting: Pediatrics

## 2016-09-21 ENCOUNTER — Ambulatory Visit (INDEPENDENT_AMBULATORY_CARE_PROVIDER_SITE_OTHER): Payer: Medicaid Other | Admitting: Pediatrics

## 2016-09-21 VITALS — BP 127/85 | HR 95 | Temp 97.4°F | Resp 20 | Ht 65.0 in | Wt 205.2 lb

## 2016-09-21 DIAGNOSIS — G4734 Idiopathic sleep related nonobstructive alveolar hypoventilation: Secondary | ICD-10-CM

## 2016-09-21 NOTE — Progress Notes (Addendum)
  Subjective:   Patient ID: Jay Ortega, male    DOB: 09/07/1965, 51 y.o.   MRN: [id number] CC: Sleep Study and Face to Face  HPI: Jay Ortega is a 51 y.o. male presenting for Sleep Study and Face to Face  Sleep study 02/2016, had oxygen desaturations and snoring Did not show OSA, was told he no longer needs CPAP machine Both pt and wife concerned, say he has slept poorly since being off of CPAP Had been on it for 10 yrs He wasn't on narcotic medicine for his chronic pain at the time of the study as he was establishing care in this area after recent move  Had a nocturnal O2 home test end of June with desaturations Oxygen ordered but he needs a face-to-face within 30 days of study for insurance After talking with multiple DME companies told he needs a new sleep study to qualify for O2 Will repeat test tonight  Relevant past medical, surgical, family and social history reviewed. Allergies and medications reviewed and updated. History  Smoking Status  . Current Every Day Smoker  . Types: Cigarettes  Smokeless Tobacco  . Never Used   ROS: Per HPI   Objective:    BP 127/85   Pulse 95   Temp (!) 97.4 F (36.3 C) (Oral)   Resp 20   Ht 5\' 5"  (1.651 m)   Wt 205 lb 3.2 oz (93.1 kg)   SpO2 95%   BMI 34.15 kg/m   Wt Readings from Last 3 Encounters:  09/21/16 205 lb 3.2 oz (93.1 kg)  07/05/16 204 lb (92.5 kg)  05/23/16 218 lb (98.9 kg)    Gen: NAD, alert, cooperative with exam, NCAT EYES: EOMI, no conjunctival injection, or no icterus CV: NRRR, normal S1/S2 Resp: CTABL, no wheezes, normal WOB Abd: +BS, soft, NTND.  Ext: No edema, warm Neuro: Alert and oriented  Assessment & Plan:  Jay Ortega was seen today for sleep study and face to face.  Diagnoses and all orders for this visit:  Nocturnal hypoxia Asthma Repeat nocturnal O2 sats  Referral to pulm for eval chronic lung disease, pt also very interested in OSA evaluation as sleeping poorly without CPAP machine. -      Ambulatory referral to Pulmonology   Follow up plan: With pcp as scheudled [name], [facility]  ADDENDUM:  Sleep study 09/21/16 with 1:36:48 of time spent < 88%, 18% of night. See sleep study for more information.  2L of o2 by  ordered  Has been on symbicort, albuterol, O2 sats continue to drop

## 2016-09-26 ENCOUNTER — Telehealth: Payer: Self-pay | Admitting: Family Medicine

## 2016-09-26 NOTE — Telephone Encounter (Signed)
Jay ArnoldShirley Taras patient's wife aware that oximetry results were placed in Dr. Jacqualin CombesVincent's hands and order will be written today.  Wife verbalized understanding.

## 2016-09-28 ENCOUNTER — Telehealth: Payer: Self-pay | Admitting: Pediatrics

## 2016-09-28 NOTE — Telephone Encounter (Signed)
Spokey with patient's wife.  Informed her that addendum was faxed over and that Olegario MessierKathy at advance directive was contacted and we are working on getting oxygen sent to patient by tomorrow evening.  Wife verbalized understanding.  Will check with advance directive to check on progress tomorrow morning.

## 2016-09-28 NOTE — Telephone Encounter (Signed)
Jay ArnoldShirley Fugere, wife of patient, called to voice her frustration regarding our office and the process with how her husband's oxygen orders have been handled.  [facility] is waiting on an addendum to the order from Dr. Oswaldo DoneVincent so they may have the oxygen placed in his home today.    Please return her call today so she may know this is taken care of.  He needs this oxygen and has been without.

## 2016-10-07 ENCOUNTER — Ambulatory Visit (INDEPENDENT_AMBULATORY_CARE_PROVIDER_SITE_OTHER): Payer: Medicaid Other | Admitting: Family Medicine

## 2016-10-07 ENCOUNTER — Encounter: Payer: Self-pay | Admitting: Family Medicine

## 2016-10-07 ENCOUNTER — Ambulatory Visit (INDEPENDENT_AMBULATORY_CARE_PROVIDER_SITE_OTHER): Payer: Medicaid Other

## 2016-10-07 VITALS — BP 138/97 | HR 94 | Ht 65.0 in | Wt 206.0 lb

## 2016-10-07 DIAGNOSIS — J45909 Unspecified asthma, uncomplicated: Secondary | ICD-10-CM

## 2016-10-09 NOTE — Progress Notes (Signed)
Subjective:  Patient ID: Jay Ortega, male    DOB: [birth year]  Age: 51 y.o. MRN: [id number]  CC: Hypertension (pt here today for routine follow up of his chronic medical conditions)   HPI Amin Fornwalt presents for  follow-up of hypertension. Patient has no history of headache chest pain or shortness of breath or recent cough. Patient also denies symptoms of TIA such as numbness weakness lateralizing. Patient checks  blood pressure at home and has not had any elevated readings recently. Patient denies side effects from his medication. States taking it regularly. Also he is a smoker and has dyspnea with exertion . Back pain flaring. Has surgery scheduled with neurosurgeon, Dr. Ralene Ok on10/23. Would like to get in sooner if possible.   Depression screen [facility] 2/9 09/21/2016 07/05/2016 05/23/2016  Decreased Interest 0 1 2  Down, Depressed, Hopeless 0 1 1  PHQ - 2 Score 0 2 3  Altered sleeping - 2 3  Tired, decreased energy - 2 2  Change in appetite - 0 0  Feeling bad or failure about yourself  - 1 2  Trouble concentrating - 1 1  Moving slowly or fidgety/restless - 0 0  Suicidal thoughts - 0 0  PHQ-9 Score - 8 11    History Neal has a past medical history of Asthma; Foot drop, right; Hypertension; and Sleep apnea.   He has a past surgical history that includes Spine surgery; Rotator cuff repair; Carpal tunnel release (Bilateral); Knee arthroscopy (Left); and Cervical fusion.   His family history includes Cancer in his father; Diabetes in his mother.He reports that he has been smoking Cigarettes.  He has never used smokeless tobacco. He reports that he does not drink alcohol or use drugs.    ROS Review of Systems  Constitutional: Negative for chills, diaphoresis and fever.  HENT: Negative for rhinorrhea and sore throat.   Respiratory: Negative for cough and shortness of breath.   Cardiovascular: Negative for chest pain.  Gastrointestinal: Negative for abdominal pain.    Musculoskeletal: Negative for arthralgias and myalgias.  Skin: Negative for rash.  Neurological: Negative for weakness and headaches.    Objective:  BP (!) 138/97   Pulse 94   Ht 5' 5"  (1.651 m)   Wt 206 lb (93.4 kg)   BMI 34.28 kg/m   BP Readings from Last 3 Encounters:  10/07/16 (!) 138/97  09/21/16 127/85  07/05/16 (!) 142/93    Wt Readings from Last 3 Encounters:  10/07/16 206 lb (93.4 kg)  09/21/16 205 lb 3.2 oz (93.1 kg)  07/05/16 204 lb (92.5 kg)     Physical Exam  Constitutional: He appears well-developed and well-nourished.  HENT:  Head: Normocephalic and atraumatic.  Right Ear: Tympanic membrane and external ear normal. No decreased hearing is noted.  Left Ear: Tympanic membrane and external ear normal. No decreased hearing is noted.  Mouth/Throat: No oropharyngeal exudate or posterior oropharyngeal erythema.  Eyes: Pupils are equal, round, and reactive to light.  Neck: Normal range of motion. Neck supple.  Cardiovascular: Normal rate and regular rhythm.   No murmur heard. Pulmonary/Chest: Breath sounds normal. No respiratory distress.  Abdominal: Soft. Bowel sounds are normal. He exhibits no mass. There is no tenderness.  Vitals reviewed.     Assessment & Plan:   Dyquan was seen today for hypertension.  Diagnoses and all orders for this visit:  Moderate asthma without complication, unspecified whether persistent -     PR BREATHING CAPACITY TEST -  DG Chest 2 View; Future       I am having Mr. Nephew maintain his RANITIDINE HCL PO, budesonide-formoterol, Oxycodone HCl, Nebulizer/Tubing/Mouthpiece, albuterol, albuterol, cloNIDine, ketoconazole, pramipexole, ketoconazole, and mirtazapine.  Allergies as of 10/07/2016      Reactions   Codeine Shortness Of Breath   Hives   Other    Tomatoes cause fever blisters.      Medication List       Accurate as of 10/07/16 11:59 PM. Always use your most recent med list.          albuterol 1.25  MG/3ML nebulizer solution Commonly known as:  ACCUNEB Take 3 mLs (1.25 mg total) by nebulization every 6 (six) hours as needed for wheezing.   albuterol 108 (90 Base) MCG/ACT inhaler Commonly known as:  VENTOLIN HFA Inhale 2 puffs into the lungs every 6 (six) hours as needed for wheezing or shortness of breath.   budesonide-formoterol 160-4.5 MCG/ACT inhaler Commonly known as:  SYMBICORT Inhale 2 puffs into the lungs 2 (two) times daily. For COPD/Asthma   cloNIDine 0.1 MG tablet Commonly known as:  CATAPRES Take 1 tablet (0.1 mg total) by mouth 2 (two) times daily. For Blood Pressure   ketoconazole 2 % shampoo Commonly known as:  NIZORAL Apply 1 application topically 3 (three) times a week. Shampoo in as usual, allow to stay on scalp for 5 min. Before rinsing   ketoconazole 2 % cream Commonly known as:  NIZORAL Apply 1 application topically 2 (two) times daily. For rash   mirtazapine 30 MG tablet Commonly known as:  REMERON TAKE 1 TABLET AT BEDTIME AS NEEDED FOR SLEEP AND MOOD   Nebulizer/Tubing/Mouthpiece Kit Use with nebulizer QID   Oxycodone HCl 10 MG Tabs Take 1 tablet (10 mg total) by mouth 3 (three) times daily as needed (for severe pain only).   pramipexole 0.125 MG tablet Commonly known as:  MIRAPEX 1 three times daily for 1 wk, then 2 TID for a week, then 3 TID for leg cramps   RANITIDINE HCL PO Take by mouth.            Discharge Care Instructions        Start     Ordered   10/07/16 0000  PR BREATHING CAPACITY TEST     10/07/16 1057   10/07/16 0000  DG Chest 2 View    Question Answer Comment  Reason for Exam (SYMPTOM  OR DIAGNOSIS REQUIRED) asthma   Preferred imaging location? Internal      10/07/16 1057       Follow-up: Return in about 3 months (around 01/07/2017).  Claretta Fraise, M.D.

## 2016-10-18 ENCOUNTER — Other Ambulatory Visit: Payer: Self-pay | Admitting: Family Medicine

## 2016-11-15 ENCOUNTER — Other Ambulatory Visit: Payer: Self-pay | Admitting: Family Medicine

## 2016-11-16 ENCOUNTER — Other Ambulatory Visit: Payer: Self-pay | Admitting: *Deleted

## 2016-11-16 MED ORDER — MIRTAZAPINE 30 MG PO TABS: ORAL_TABLET | ORAL | 0 refills | Status: DC

## 2016-11-16 NOTE — Progress Notes (Unsigned)
rx printed so rx called into [facility] pharmacy.

## 2016-11-30 ENCOUNTER — Encounter (HOSPITAL_COMMUNITY)
Admission: RE | Admit: 2016-11-30 | Discharge: 2016-11-30 | Disposition: A | Discharge status: Home / Self Care | Payer: Medicaid Other | Source: Ambulatory Visit | Attending: Neurosurgery | Admitting: Neurosurgery

## 2016-11-30 ENCOUNTER — Other Ambulatory Visit: Payer: Self-pay

## 2016-11-30 ENCOUNTER — Encounter (HOSPITAL_COMMUNITY): Payer: Self-pay

## 2016-11-30 DIAGNOSIS — Z01812 Encounter for preprocedural laboratory examination: Secondary | ICD-10-CM | POA: Insufficient documentation

## 2016-11-30 HISTORY — DX: Unspecified osteoarthritis, unspecified site: M19.90

## 2016-11-30 LAB — TYPE AND SCREEN
ABO/RH(D): O POS
ANTIBODY SCREEN: NEGATIVE

## 2016-11-30 LAB — CBC
HCT: 48.1 % (ref 39.0–52.0)
HEMOGLOBIN: 16.3 g/dL (ref 13.0–17.0)
MCH: 30.7 pg (ref 26.0–34.0)
MCHC: 33.9 g/dL (ref 30.0–36.0)
MCV: 90.6 fL (ref 78.0–100.0)
Platelets: 266 10*3/uL (ref 150–400)
RBC: 5.31 MIL/uL (ref 4.22–5.81)
RDW: 13.5 % (ref 11.5–15.5)
WBC: 9.1 10*3/uL (ref 4.0–10.5)

## 2016-11-30 LAB — BASIC METABOLIC PANEL
ANION GAP: 9 (ref 5–15)
BUN: 10 mg/dL (ref 6–20)
CALCIUM: 9.4 mg/dL (ref 8.9–10.3)
CO2: 27 mmol/L (ref 22–32)
CREATININE: 0.71 mg/dL (ref 0.61–1.24)
Chloride: 104 mmol/L (ref 101–111)
GFR calc Af Amer: >60 mL/min (ref >60)
GFR calc non Af Amer: >60 mL/min (ref >60)
GLUCOSE: 110 mg/dL — AB (ref 65–99)
Potassium: 4.5 mmol/L (ref 3.5–5.1)
Sodium: 140 mmol/L (ref 135–145)

## 2016-11-30 LAB — SURGICAL PCR SCREEN
MRSA, PCR: NEGATIVE
STAPHYLOCOCCUS AUREUS: POSITIVE — AB

## 2016-11-30 LAB — ABO/RH: ABO/RH(D): O POS

## 2016-11-30 NOTE — Pre-Procedure Instructions (Signed)
Marlene Larderry Boettcher  11/30/2016      [facility] - [place] - [address] [place] [address] Phone: [phone] Fax: [phone]  [facility] [place] [place] [address] Phone: [phone] Fax: [phone]    Your procedure is scheduled on October 23  [facility] at [facility].M.  Call this number if you have problems the morning of surgery:  [phone]   Remember:  Do not eat food or drink liquids after midnight.  Continue all other medications as directed by your physician except follow these medication instructions before surgery   Take these medicines the morning of surgery with A SIP OF WATER albuterol (ACCUNEB) if needed albuterol (VENTOLIN HFA) if needed cloNIDine (CATAPRES) Tylenol if needed  Oxycodone if needed  7 days prior to surgery STOP taking any Aspirin (unless otherwise instructed by your surgeon), Aleve, Naproxen, Ibuprofen, Motrin, Advil, Goody's, BC's, all herbal medications, fish oil, and all vitamins    Do not wear jewelry  Do not wear lotions, powders, or cologne, or deoderant.  Men may shave face and neck.  Do not bring valuables to the hospital.  [facility] is not responsible for any belongings or valuables.  Contacts, dentures or bridgework may not be worn into surgery.  Leave your suitcase in the car.  After surgery it may be brought to your room.  For patients admitted to the hospital, discharge time will be determined by your treatment team.  Patients discharged the day of surgery will not be allowed to drive home.    Special instructions:   [facility]- Preparing For Surgery  Before surgery, you can play an important role. Because skin is not sterile, your skin needs to be as free of germs as possible. You can reduce the number of germs on your skin by washing with CHG (chlorahexidine  gluconate) Soap before surgery.  CHG is an antiseptic cleaner which kills germs and bonds with the skin to continue killing germs even after washing.  Please do not use if you have an allergy to CHG or antibacterial soaps. If your skin becomes reddened/irritated stop using the CHG.  Do not shave (including legs and underarms) for at least 48 hours prior to first CHG shower. It is OK to shave your face.  Please follow these instructions carefully.   1. Shower the NIGHT BEFORE SURGERY and the MORNING OF SURGERY with CHG.   2. If you chose to wash your hair, wash your hair first as usual with your normal shampoo.  3. After you shampoo, rinse your hair and body thoroughly to remove the shampoo.  4. Use CHG as you would any other liquid soap. You can apply CHG directly to the skin and wash gently with a scrungie or a clean washcloth.   5. Apply the CHG Soap to your body ONLY FROM THE NECK DOWN.  Do not use on open wounds or open sores. Avoid contact with your eyes, ears, mouth and genitals (private parts). Wash Face and genitals (private parts)  with your normal soap.  6. Wash thoroughly, paying special attention to the area where your surgery will be performed.  7. Thoroughly rinse your body with warm water from the neck down.  8. DO NOT shower/wash with your normal soap after using and rinsing off the CHG Soap.  9. Pat yourself dry with a CLEAN TOWEL.  10.  Wear CLEAN PAJAMAS to bed the night before surgery, wear comfortable clothes the morning of surgery  11. Place CLEAN SHEETS on your bed the night of your first shower and DO NOT SLEEP WITH PETS.    Day of Surgery: Do not apply any deodorants/lotions. Please wear clean clothes to the hospital/surgery center.      Please read over the following fact sheets that you were given.

## 2016-11-30 NOTE — Progress Notes (Signed)
[phone], contacted patient about results and prescription

## 2016-11-30 NOTE — Progress Notes (Signed)
PCP - warren Stacks Cardiologist - denies  Chest x-ray - not needed EKG - 11/30/16 Stress Test - > 5 yers ECHO - denies Cardiac Cath - denies  Sleep Study - 2018 CPAP - patient wore CPAP but due to new sleep study being negative the patient no longer as CPAP   Patient denies shortness of breath, fever, cough and chest pain at PAT appointment   Patient verbalized understanding of instructions that were given to them at the PAT appointment. Patient was also instructed that they will need to review over the PAT instructions again at home before surgery.

## 2016-11-30 NOTE — Progress Notes (Signed)
   11/30/16 0922  OBSTRUCTIVE SLEEP APNEA  Have you ever been diagnosed with sleep apnea through a sleep study? No (negative sleep study 2018)  If yes, do you have and use a CPAP or BPAP machine every night? 0 (used to use a CPAP)  Do you snore loudly (loud enough to be heard through closed doors)?  1  Do you often feel tired, fatigued, or sleepy during the daytime (such as falling asleep during driving or talking to someone)? 0  Has anyone observed you stop breathing during your sleep? 1  Do you have, or are you being treated for high blood pressure? 1  BMI more than 35 kg/m2? 0  Age > 50 (1-yes) 1  Neck circumference greater than:Male 16 inches or larger, Male 17inches or larger? 0  Male Gender (Yes=1) 1  Obstructive Sleep Apnea Score 5  Score 5 or greater  Results sent to PCP

## 2016-12-06 ENCOUNTER — Encounter (HOSPITAL_COMMUNITY): Payer: Self-pay

## 2016-12-06 ENCOUNTER — Inpatient Hospital Stay (HOSPITAL_COMMUNITY)
Admission: RE | Disposition: A | Discharge status: Home / Self Care | Payer: Self-pay | Source: Ambulatory Visit | Attending: Neurosurgery

## 2016-12-06 ENCOUNTER — Inpatient Hospital Stay (HOSPITAL_COMMUNITY): Payer: Medicaid Other | Admitting: Certified Registered"

## 2016-12-06 ENCOUNTER — Inpatient Hospital Stay (HOSPITAL_COMMUNITY): Payer: Medicaid Other

## 2016-12-06 ENCOUNTER — Inpatient Hospital Stay (HOSPITAL_COMMUNITY)
Admission: RE | Admit: 2016-12-06 | Discharge: 2016-12-07 | DRG: 460 | Disposition: A | Discharge status: Home / Self Care | Payer: Medicaid Other | Source: Ambulatory Visit | Attending: Neurosurgery | Admitting: Neurosurgery

## 2016-12-06 DIAGNOSIS — E669 Obesity, unspecified: Secondary | ICD-10-CM | POA: Diagnosis present

## 2016-12-06 DIAGNOSIS — J45909 Unspecified asthma, uncomplicated: Secondary | ICD-10-CM | POA: Diagnosis present

## 2016-12-06 DIAGNOSIS — F17210 Nicotine dependence, cigarettes, uncomplicated: Secondary | ICD-10-CM | POA: Diagnosis present

## 2016-12-06 DIAGNOSIS — M5116 Intervertebral disc disorders with radiculopathy, lumbar region: Principal | ICD-10-CM | POA: Diagnosis present

## 2016-12-06 DIAGNOSIS — M4316 Spondylolisthesis, lumbar region: Secondary | ICD-10-CM | POA: Diagnosis present

## 2016-12-06 DIAGNOSIS — G9619 Other disorders of meninges, not elsewhere classified: Secondary | ICD-10-CM | POA: Diagnosis present

## 2016-12-06 DIAGNOSIS — Z6833 Body mass index (BMI) 33.0-33.9, adult: Secondary | ICD-10-CM | POA: Diagnosis not present

## 2016-12-06 DIAGNOSIS — M48061 Spinal stenosis, lumbar region without neurogenic claudication: Secondary | ICD-10-CM | POA: Diagnosis present

## 2016-12-06 DIAGNOSIS — M21371 Foot drop, right foot: Secondary | ICD-10-CM | POA: Diagnosis present

## 2016-12-06 DIAGNOSIS — Z981 Arthrodesis status: Secondary | ICD-10-CM | POA: Diagnosis not present

## 2016-12-06 DIAGNOSIS — Z79899 Other long term (current) drug therapy: Secondary | ICD-10-CM

## 2016-12-06 DIAGNOSIS — G4730 Sleep apnea, unspecified: Secondary | ICD-10-CM | POA: Diagnosis present

## 2016-12-06 DIAGNOSIS — G8929 Other chronic pain: Secondary | ICD-10-CM | POA: Diagnosis present

## 2016-12-06 DIAGNOSIS — M4807 Spinal stenosis, lumbosacral region: Secondary | ICD-10-CM | POA: Diagnosis present

## 2016-12-06 DIAGNOSIS — M4317 Spondylolisthesis, lumbosacral region: Secondary | ICD-10-CM | POA: Diagnosis present

## 2016-12-06 DIAGNOSIS — I10 Essential (primary) hypertension: Secondary | ICD-10-CM | POA: Diagnosis present

## 2016-12-06 DIAGNOSIS — Z419 Encounter for procedure for purposes other than remedying health state, unspecified: Secondary | ICD-10-CM

## 2016-12-06 DIAGNOSIS — M5117 Intervertebral disc disorders with radiculopathy, lumbosacral region: Secondary | ICD-10-CM | POA: Diagnosis present

## 2016-12-06 DIAGNOSIS — M5416 Radiculopathy, lumbar region: Secondary | ICD-10-CM | POA: Diagnosis present

## 2016-12-06 DIAGNOSIS — M545 Low back pain: Secondary | ICD-10-CM | POA: Diagnosis present

## 2016-12-06 HISTORY — DX: Sciatica, right side: M54.31

## 2016-12-06 LAB — POCT I-STAT 4, (NA,K, GLUC, HGB,HCT)
Glucose, Bld: 133 mg/dL — ABNORMAL HIGH (ref 65–99)
HCT: 41 % (ref 39.0–52.0)
Hemoglobin: 13.9 g/dL (ref 13.0–17.0)
Potassium: 5 mmol/L (ref 3.5–5.1)
SODIUM: 140 mmol/L (ref 135–145)

## 2016-12-06 SURGERY — POSTERIOR LUMBAR FUSION 2 LEVEL
Anesthesia: General

## 2016-12-06 MED ORDER — SUGAMMADEX SODIUM 200 MG/2ML IV SOLN
INTRAVENOUS | Status: DC | PRN
  Administered 2016-12-06: 200 mg via INTRAVENOUS

## 2016-12-06 MED ORDER — ALUM & MAG HYDROXIDE-SIMETH 200-200-20 MG/5ML PO SUSP
30.0000 mL | Freq: Four times a day (QID) | ORAL | Status: DC | PRN
  Administered 2016-12-07: 30 mL via ORAL
  Filled 2016-12-06: qty 30

## 2016-12-06 MED ORDER — SODIUM CHLORIDE 0.9% FLUSH
3.0000 mL | Freq: Two times a day (BID) | INTRAVENOUS | Status: DC
  Administered 2016-12-06 – 2016-12-07 (×2): 3 mL via INTRAVENOUS

## 2016-12-06 MED ORDER — FENTANYL CITRATE (PF) 100 MCG/2ML IJ SOLN
INTRAMUSCULAR | Status: AC
  Filled 2016-12-06: qty 2

## 2016-12-06 MED ORDER — ROCURONIUM BROMIDE 10 MG/ML (PF) SYRINGE
PREFILLED_SYRINGE | INTRAVENOUS | Status: DC | PRN
  Administered 2016-12-06: 20 mg via INTRAVENOUS
  Administered 2016-12-06: 30 mg via INTRAVENOUS
  Administered 2016-12-06: 20 mg via INTRAVENOUS
  Administered 2016-12-06: 60 mg via INTRAVENOUS
  Administered 2016-12-06: 20 mg via INTRAVENOUS
  Administered 2016-12-06: 40 mg via INTRAVENOUS
  Administered 2016-12-06: 30 mg via INTRAVENOUS
  Administered 2016-12-06: 20 mg via INTRAVENOUS

## 2016-12-06 MED ORDER — CHLORHEXIDINE GLUCONATE CLOTH 2 % EX PADS: 6.0000 | MEDICATED_PAD | Freq: Once | CUTANEOUS | Status: DC

## 2016-12-06 MED ORDER — TAMSULOSIN HCL 0.4 MG PO CAPS
0.4000 mg | ORAL_CAPSULE | Freq: Every day | ORAL | Status: DC
  Administered 2016-12-06 – 2016-12-07 (×2): 0.4 mg via ORAL
  Filled 2016-12-06 (×2): qty 1

## 2016-12-06 MED ORDER — BACITRACIN 50000 UNITS IM SOLR
INTRAMUSCULAR | Status: DC | PRN
  Administered 2016-12-06: 09:00:00

## 2016-12-06 MED ORDER — OXYCODONE HCL 5 MG PO TABS
10.0000 mg | ORAL_TABLET | ORAL | Status: DC | PRN
  Administered 2016-12-06 – 2016-12-07 (×6): 10 mg via ORAL
  Filled 2016-12-06 (×5): qty 2

## 2016-12-06 MED ORDER — ZOLPIDEM TARTRATE 5 MG PO TABS
5.0000 mg | ORAL_TABLET | Freq: Every evening | ORAL | Status: DC | PRN
Start: 2016-12-06 — End: 2016-12-07

## 2016-12-06 MED ORDER — METHOCARBAMOL 1000 MG/10ML IJ SOLN: 500.0000 mg | Freq: Four times a day (QID) | INTRAVENOUS | Status: DC | PRN

## 2016-12-06 MED ORDER — FENTANYL CITRATE (PF) 100 MCG/2ML IJ SOLN
INTRAMUSCULAR | Status: DC | PRN
  Administered 2016-12-06 (×3): 50 ug via INTRAVENOUS
  Administered 2016-12-06 (×2): 100 ug via INTRAVENOUS
  Administered 2016-12-06: 50 ug via INTRAVENOUS
  Administered 2016-12-06: 100 ug via INTRAVENOUS
  Administered 2016-12-06 (×2): 50 ug via INTRAVENOUS
  Administered 2016-12-06: 100 ug via INTRAVENOUS
  Administered 2016-12-06: 50 ug via INTRAVENOUS

## 2016-12-06 MED ORDER — OXYCODONE HCL 5 MG PO TABS
ORAL_TABLET | ORAL | Status: AC
  Filled 2016-12-06: qty 2

## 2016-12-06 MED ORDER — THROMBIN (RECOMBINANT) 5000 UNITS EX SOLR
CUTANEOUS | Status: AC
  Filled 2016-12-06: qty 5000

## 2016-12-06 MED ORDER — SENNOSIDES-DOCUSATE SODIUM 8.6-50 MG PO TABS: 1.0000 | ORAL_TABLET | Freq: Every evening | ORAL | Status: DC | PRN

## 2016-12-06 MED ORDER — PROPOFOL 10 MG/ML IV BOLUS
INTRAVENOUS | Status: AC
  Filled 2016-12-06: qty 20

## 2016-12-06 MED ORDER — CEFAZOLIN SODIUM-DEXTROSE 2-4 GM/100ML-% IV SOLN
2.0000 g | INTRAVENOUS | Status: AC
  Administered 2016-12-06: 2 g via INTRAVENOUS
  Filled 2016-12-06: qty 100

## 2016-12-06 MED ORDER — KETOROLAC TROMETHAMINE 30 MG/ML IJ SOLN
INTRAMUSCULAR | Status: AC
  Filled 2016-12-06: qty 1

## 2016-12-06 MED ORDER — ONDANSETRON HCL 4 MG/2ML IJ SOLN: 4.0000 mg | Freq: Four times a day (QID) | INTRAMUSCULAR | Status: DC | PRN

## 2016-12-06 MED ORDER — FENTANYL CITRATE (PF) 250 MCG/5ML IJ SOLN
INTRAMUSCULAR | Status: AC
  Filled 2016-12-06: qty 5

## 2016-12-06 MED ORDER — MIDAZOLAM HCL 2 MG/2ML IJ SOLN
INTRAMUSCULAR | Status: DC | PRN
  Administered 2016-12-06: 2 mg via INTRAVENOUS

## 2016-12-06 MED ORDER — ACETAMINOPHEN 325 MG PO TABS
650.0000 mg | ORAL_TABLET | ORAL | Status: DC | PRN
  Filled 2016-12-06: qty 2

## 2016-12-06 MED ORDER — CEFAZOLIN SODIUM-DEXTROSE 2-4 GM/100ML-% IV SOLN
2.0000 g | Freq: Three times a day (TID) | INTRAVENOUS | Status: AC
  Administered 2016-12-06 (×2): 2 g via INTRAVENOUS
  Filled 2016-12-06 (×2): qty 100

## 2016-12-06 MED ORDER — HEMOSTATIC AGENTS (NO CHARGE) OPTIME
TOPICAL | Status: DC | PRN
  Administered 2016-12-06: 1 via TOPICAL

## 2016-12-06 MED ORDER — PROMETHAZINE HCL 25 MG/ML IJ SOLN: 6.2500 mg | INTRAMUSCULAR | Status: DC | PRN

## 2016-12-06 MED ORDER — OXYCODONE HCL ER 10 MG PO T12A
10.0000 mg | EXTENDED_RELEASE_TABLET | Freq: Two times a day (BID) | ORAL | Status: DC
  Administered 2016-12-07: 10 mg via ORAL
  Filled 2016-12-06: qty 1

## 2016-12-06 MED ORDER — BISACODYL 10 MG RE SUPP
10.0000 mg | Freq: Every day | RECTAL | Status: DC | PRN
Start: 2016-12-06 — End: 2016-12-07

## 2016-12-06 MED ORDER — ONDANSETRON HCL 4 MG/2ML IJ SOLN
INTRAMUSCULAR | Status: AC
  Filled 2016-12-06: qty 2

## 2016-12-06 MED ORDER — THROMBIN (RECOMBINANT) 5000 UNITS EX SOLR
OROMUCOSAL | Status: DC | PRN
  Administered 2016-12-06: 09:00:00 via TOPICAL

## 2016-12-06 MED ORDER — LIDOCAINE 2% (20 MG/ML) 5 ML SYRINGE
INTRAMUSCULAR | Status: DC | PRN
  Administered 2016-12-06: 100 mg via INTRAVENOUS
  Administered 2016-12-06: 50 mg via INTRAVENOUS

## 2016-12-06 MED ORDER — ROCURONIUM BROMIDE 10 MG/ML (PF) SYRINGE
PREFILLED_SYRINGE | INTRAVENOUS | Status: AC
  Filled 2016-12-06: qty 10

## 2016-12-06 MED ORDER — ACETAMINOPHEN 500 MG PO TABS
1000.0000 mg | ORAL_TABLET | Freq: Four times a day (QID) | ORAL | Status: DC
  Administered 2016-12-06 (×2): 1000 mg via ORAL
  Filled 2016-12-06 (×2): qty 2

## 2016-12-06 MED ORDER — LIDOCAINE 2% (20 MG/ML) 5 ML SYRINGE
INTRAMUSCULAR | Status: AC
  Filled 2016-12-06: qty 5

## 2016-12-06 MED ORDER — MENTHOL 3 MG MT LOZG: 1.0000 | LOZENGE | OROMUCOSAL | Status: DC | PRN

## 2016-12-06 MED ORDER — LACTATED RINGERS IV SOLN
INTRAVENOUS | Status: DC | PRN
  Administered 2016-12-06 (×2): via INTRAVENOUS

## 2016-12-06 MED ORDER — DIPHENHYDRAMINE HCL 50 MG/ML IJ SOLN
INTRAMUSCULAR | Status: AC
  Filled 2016-12-06: qty 1

## 2016-12-06 MED ORDER — METHOCARBAMOL 500 MG PO TABS
ORAL_TABLET | ORAL | Status: AC
  Filled 2016-12-06: qty 1

## 2016-12-06 MED ORDER — THROMBIN (RECOMBINANT) 20000 UNITS EX SOLR
CUTANEOUS | Status: DC | PRN
  Administered 2016-12-06: 09:00:00 via TOPICAL

## 2016-12-06 MED ORDER — ALBUMIN HUMAN 5 % IV SOLN
INTRAVENOUS | Status: DC | PRN
  Administered 2016-12-06: 11:00:00 via INTRAVENOUS

## 2016-12-06 MED ORDER — HYDROCODONE-ACETAMINOPHEN 5-325 MG PO TABS: 1.0000 | ORAL_TABLET | ORAL | Status: DC | PRN

## 2016-12-06 MED ORDER — ACETAMINOPHEN 650 MG RE SUPP: 650.0000 mg | RECTAL | Status: DC | PRN

## 2016-12-06 MED ORDER — GABAPENTIN 300 MG PO CAPS
300.0000 mg | ORAL_CAPSULE | Freq: Three times a day (TID) | ORAL | Status: DC
  Administered 2016-12-06 – 2016-12-07 (×3): 300 mg via ORAL
  Filled 2016-12-06 (×3): qty 1

## 2016-12-06 MED ORDER — LIDOCAINE-EPINEPHRINE 1 %-1:100000 IJ SOLN
INTRAMUSCULAR | Status: AC
  Filled 2016-12-06: qty 1

## 2016-12-06 MED ORDER — DEXAMETHASONE SODIUM PHOSPHATE 10 MG/ML IJ SOLN
INTRAMUSCULAR | Status: AC
  Filled 2016-12-06: qty 1

## 2016-12-06 MED ORDER — ONDANSETRON HCL 4 MG PO TABS: 4.0000 mg | ORAL_TABLET | Freq: Four times a day (QID) | ORAL | Status: DC | PRN

## 2016-12-06 MED ORDER — FENTANYL CITRATE (PF) 100 MCG/2ML IJ SOLN
25.0000 ug | INTRAMUSCULAR | Status: DC | PRN
  Administered 2016-12-06 (×2): 50 ug via INTRAVENOUS

## 2016-12-06 MED ORDER — DIPHENHYDRAMINE HCL 50 MG/ML IJ SOLN
INTRAMUSCULAR | Status: DC | PRN
  Administered 2016-12-06: 12.5 mg via INTRAVENOUS

## 2016-12-06 MED ORDER — SUGAMMADEX SODIUM 200 MG/2ML IV SOLN
INTRAVENOUS | Status: AC
  Filled 2016-12-06: qty 2

## 2016-12-06 MED ORDER — HYDROMORPHONE HCL 1 MG/ML IJ SOLN
INTRAMUSCULAR | Status: AC
  Filled 2016-12-06: qty 1

## 2016-12-06 MED ORDER — SODIUM CHLORIDE 0.9 % IV SOLN: INTRAVENOUS | Status: DC

## 2016-12-06 MED ORDER — ONDANSETRON HCL 4 MG/2ML IJ SOLN
INTRAMUSCULAR | Status: DC | PRN
  Administered 2016-12-06: 4 mg via INTRAVENOUS

## 2016-12-06 MED ORDER — PHENOL 1.4 % MT LIQD: 1.0000 | OROMUCOSAL | Status: DC | PRN

## 2016-12-06 MED ORDER — METHOCARBAMOL 500 MG PO TABS
500.0000 mg | ORAL_TABLET | Freq: Four times a day (QID) | ORAL | Status: DC | PRN
  Administered 2016-12-06 – 2016-12-07 (×4): 500 mg via ORAL
  Filled 2016-12-06 (×3): qty 1

## 2016-12-06 MED ORDER — DOCUSATE SODIUM 100 MG PO CAPS
100.0000 mg | ORAL_CAPSULE | Freq: Two times a day (BID) | ORAL | Status: DC
  Administered 2016-12-06 – 2016-12-07 (×3): 100 mg via ORAL
  Filled 2016-12-06 (×3): qty 1

## 2016-12-06 MED ORDER — OXYCODONE HCL ER 10 MG PO T12A
10.0000 mg | EXTENDED_RELEASE_TABLET | ORAL | Status: AC
  Administered 2016-12-06: 10 mg via ORAL

## 2016-12-06 MED ORDER — 0.9 % SODIUM CHLORIDE (POUR BTL) OPTIME
TOPICAL | Status: DC | PRN
  Administered 2016-12-06: 1000 mL

## 2016-12-06 MED ORDER — SODIUM CHLORIDE 0.9% FLUSH: 3.0000 mL | INTRAVENOUS | Status: DC | PRN

## 2016-12-06 MED ORDER — BUPIVACAINE HCL (PF) 0.5 % IJ SOLN
INTRAMUSCULAR | Status: AC
  Filled 2016-12-06: qty 30

## 2016-12-06 MED ORDER — THROMBIN 20000 UNITS EX KIT
PACK | CUTANEOUS | Status: AC
  Filled 2016-12-06: qty 1

## 2016-12-06 MED ORDER — MEPERIDINE HCL 25 MG/ML IJ SOLN: 6.2500 mg | INTRAMUSCULAR | Status: DC | PRN

## 2016-12-06 MED ORDER — SENNA 8.6 MG PO TABS
1.0000 | ORAL_TABLET | Freq: Two times a day (BID) | ORAL | Status: DC
  Administered 2016-12-06 – 2016-12-07 (×3): 8.6 mg via ORAL
  Filled 2016-12-06 (×3): qty 1

## 2016-12-06 MED ORDER — PROPOFOL 10 MG/ML IV BOLUS
INTRAVENOUS | Status: DC | PRN
  Administered 2016-12-06: 150 mg via INTRAVENOUS

## 2016-12-06 MED ORDER — MIDAZOLAM HCL 2 MG/2ML IJ SOLN
INTRAMUSCULAR | Status: AC
  Filled 2016-12-06: qty 2

## 2016-12-06 MED ORDER — DEXAMETHASONE SODIUM PHOSPHATE 10 MG/ML IJ SOLN
INTRAMUSCULAR | Status: DC | PRN
  Administered 2016-12-06: 10 mg via INTRAVENOUS

## 2016-12-06 MED ORDER — KETOROLAC TROMETHAMINE 30 MG/ML IJ SOLN
30.0000 mg | Freq: Once | INTRAMUSCULAR | Status: DC | PRN
  Administered 2016-12-06: 30 mg via INTRAVENOUS

## 2016-12-06 MED ORDER — HYDROMORPHONE HCL 1 MG/ML IJ SOLN
1.0000 mg | INTRAMUSCULAR | Status: DC | PRN
  Administered 2016-12-06 – 2016-12-07 (×7): 1 mg via INTRAVENOUS
  Filled 2016-12-06 (×6): qty 1

## 2016-12-06 MED ORDER — FLEET ENEMA 7-19 GM/118ML RE ENEM: 1.0000 | ENEMA | Freq: Once | RECTAL | Status: DC | PRN

## 2016-12-06 SURGICAL SUPPLY — 80 items
BAG DECANTER FOR FLEXI CONT (MISCELLANEOUS) ×3 IMPLANT
BASKET BONE COLLECTION (BASKET) ×3 IMPLANT
BENZOIN TINCTURE PRP APPL 2/3 (GAUZE/BANDAGES/DRESSINGS) IMPLANT
BIT DRILL 3.5 POWEREASE (BIT) ×2 IMPLANT
BIT DRILL 3.5MM POWEREASE (BIT) ×1
BLADE CLIPPER SURG (BLADE) IMPLANT
BLADE SURG 11 STRL SS (BLADE) ×3 IMPLANT
BUR MATCHSTICK NEURO 3.0 LAGG (BURR) ×3 IMPLANT
BUR PRECISION FLUTE 5.0 (BURR) ×3 IMPLANT
CANISTER SUCT 3000ML PPV (MISCELLANEOUS) ×3 IMPLANT
CARTRIDGE OIL MAESTRO DRILL (MISCELLANEOUS) ×1 IMPLANT
CLOSURE WOUND 1/2 X4 (GAUZE/BANDAGES/DRESSINGS)
CONT SPEC 4OZ CLIKSEAL STRL BL (MISCELLANEOUS) ×3 IMPLANT
COVER BACK TABLE 60X90IN (DRAPES) ×3 IMPLANT
DECANTER SPIKE VIAL GLASS SM (MISCELLANEOUS) ×3 IMPLANT
DERMABOND ADVANCED (GAUZE/BANDAGES/DRESSINGS) ×2
DERMABOND ADVANCED .7 DNX12 (GAUZE/BANDAGES/DRESSINGS) ×1 IMPLANT
DEVICE INTERBODY ELEVATE 23X7 (Cage) ×3 IMPLANT
DEVICE INTERBODY ELEVATE 23X8 (Cage) ×2 IMPLANT
DIFFUSER DRILL AIR PNEUMATIC (MISCELLANEOUS) ×3 IMPLANT
DRAPE C-ARM 42X72 X-RAY (DRAPES) ×3 IMPLANT
DRAPE C-ARMOR (DRAPES) ×3 IMPLANT
DRAPE LAPAROTOMY 100X72X124 (DRAPES) ×3 IMPLANT
DRAPE POUCH INSTRU U-SHP 10X18 (DRAPES) ×3 IMPLANT
DRAPE SURG 17X23 STRL (DRAPES) ×3 IMPLANT
DRSG OPSITE POSTOP 4X6 (GAUZE/BANDAGES/DRESSINGS) ×3 IMPLANT
DURAPREP 26ML APPLICATOR (WOUND CARE) ×3 IMPLANT
ELECT REM PT RETURN 9FT ADLT (ELECTROSURGICAL) ×3
ELECTRODE REM PT RTRN 9FT ADLT (ELECTROSURGICAL) ×1 IMPLANT
GAUZE SPONGE 4X4 12PLY STRL (GAUZE/BANDAGES/DRESSINGS) IMPLANT
GAUZE SPONGE 4X4 16PLY XRAY LF (GAUZE/BANDAGES/DRESSINGS) ×3 IMPLANT
GLOVE BIO SURGEON STRL SZ7.5 (GLOVE) IMPLANT
GLOVE BIOGEL PI IND STRL 7.5 (GLOVE) ×2 IMPLANT
GLOVE BIOGEL PI INDICATOR 7.5 (GLOVE) ×4
GLOVE ECLIPSE 7.0 STRL STRAW (GLOVE) ×3 IMPLANT
GLOVE EXAM NITRILE LRG STRL (GLOVE) IMPLANT
GLOVE EXAM NITRILE XL STR (GLOVE) IMPLANT
GLOVE EXAM NITRILE XS STR PU (GLOVE) IMPLANT
GLOVE SURG SS PI 7.5 STRL IVOR (GLOVE) ×18 IMPLANT
GOWN STRL REUS W/ TWL LRG LVL3 (GOWN DISPOSABLE) ×2 IMPLANT
GOWN STRL REUS W/ TWL XL LVL3 (GOWN DISPOSABLE) IMPLANT
GOWN STRL REUS W/TWL 2XL LVL3 (GOWN DISPOSABLE) IMPLANT
GOWN STRL REUS W/TWL LRG LVL3 (GOWN DISPOSABLE) ×4
GOWN STRL REUS W/TWL XL LVL3 (GOWN DISPOSABLE)
GRAFT DURAGEN MATRIX 1WX1L (Tissue) ×3 IMPLANT
HEMOSTAT POWDER KIT SURGIFOAM (HEMOSTASIS) ×3 IMPLANT
INVIEW EXTRA CATHETER ×3 IMPLANT
KIT BASIN OR (CUSTOM PROCEDURE TRAY) ×3 IMPLANT
KIT INFUSE X SMALL 1.4CC (Orthopedic Implant) ×3 IMPLANT
KIT POSITION SURG JACKSON T1 (MISCELLANEOUS) ×3 IMPLANT
KIT ROOM TURNOVER OR (KITS) ×3 IMPLANT
MILL MEDIUM DISP (BLADE) ×3 IMPLANT
NEEDLE HYPO 18GX1.5 BLUNT FILL (NEEDLE) IMPLANT
NEEDLE HYPO 22GX1.5 SAFETY (NEEDLE) ×3 IMPLANT
NEEDLE SPNL 18GX3.5 QUINCKE PK (NEEDLE) IMPLANT
NS IRRIG 1000ML POUR BTL (IV SOLUTION) ×3 IMPLANT
OIL CARTRIDGE MAESTRO DRILL (MISCELLANEOUS) ×3
PACK LAMINECTOMY NEURO (CUSTOM PROCEDURE TRAY) ×3 IMPLANT
PAD ARMBOARD 7.5X6 YLW CONV (MISCELLANEOUS) ×9 IMPLANT
PATTIES SURGICAL 1X1 (DISPOSABLE) ×3 IMPLANT
ROD SOLERA 55MM (Rod) ×6 IMPLANT
SCREW 5.5X35MM (Screw) ×12 IMPLANT
SCREW BN 35X5.5XMA NS SPNE (Screw) ×6 IMPLANT
SCREW SET SOLERA (Screw) ×12 IMPLANT
SCREW SET SOLERA TI (Screw) ×6 IMPLANT
SEALANT ADHERUS EXTEND TIP (MISCELLANEOUS) ×3 IMPLANT
SPACER SPNL XLORDOTIC 23X8X (Cage) ×1 IMPLANT
SPCR SPNL XLORDOTIC 23X8X (Cage) ×1 IMPLANT
SPONGE LAP 4X18 X RAY DECT (DISPOSABLE) IMPLANT
SPONGE SURGIFOAM ABS GEL 100 (HEMOSTASIS) ×3 IMPLANT
STRIP CLOSURE SKIN 1/2X4 (GAUZE/BANDAGES/DRESSINGS) IMPLANT
SUT VIC AB 0 CT1 18XCR BRD8 (SUTURE) ×3 IMPLANT
SUT VIC AB 0 CT1 8-18 (SUTURE) ×6
SUT VIC AB 3-0 FS2 27 (SUTURE) IMPLANT
SUT VICRYL 3-0 RB1 18 ABS (SUTURE) ×6 IMPLANT
SYR 3ML LL SCALE MARK (SYRINGE) IMPLANT
TOWEL GREEN STERILE (TOWEL DISPOSABLE) ×3 IMPLANT
TOWEL GREEN STERILE FF (TOWEL DISPOSABLE) ×3 IMPLANT
TRAY FOLEY W/METER SILVER 16FR (SET/KITS/TRAYS/PACK) ×9 IMPLANT
WATER STERILE IRR 1000ML POUR (IV SOLUTION) ×3 IMPLANT

## 2016-12-06 NOTE — H&P (Signed)
Chief Complaint   Back and leg pain  History of Present Illness   Mr. Jay Ortega is a 51 year old man I am seeing for the above. Briefly, he has a long history of chronic back and leg pain, and a chronic foot drop on the right. He has had multiple prior lumbar surgeries. He has undergone multiple previous conservative treatments including physical therapy and injection therapy. His last surgery was a decompressive laminectomy for synovial cyst about 1 year ago which was done at [facility] in [facility]. At our last visit we discussed surgical decompression and fusion at L4-5 and L5-S1.    PRIOR HISTORY 06/06/2016: Mr Jay Ortega is a 51 year old man seen for initial consultation. He comes in today with a primary complaint of low back pain and primarily right-sided leg pain. He has a long, chronic history of back and leg pain which she says started almost 30 years ago. He actually says that he has had about 10 or 12 low back operations, in addition to cervical fusion in the past. His last operation was in July of 2017 in [place], where he says he had removal of a cyst on his spine. He says the pain in his low back is worse than the pain in his leg. He says he has some burning, as well as numbness and tingling involving the right buttock, which travels down the right leg. Prior to his last surgery, he says he also had a complete right foot drop. After this last surgery a year ago, he had mild improvement in his foot drop to the point where he can now walk without the use of an ankle-foot orthosis, but still has weakness picking his foot up. Unfortunately, he says the pain has not really improved at all since the last surgery. He has been through multiple courses of physical therapy, stating about "once a year" over the last few years. He has also undergone prior epidural steroid injections. He was told after his last round of injections that he had a significant amount of scar tissue, and  that he would have a hard time getting more. Over the last several months he reports initiation of some burning type pain involving primarily the left buttock now as well. He says that he does have some difficulty initiating micturition, but is able to empty his bladder fully.   Past Medical History   Past Medical History:  Diagnosis Date  . Arthritis    back, hand  . Asthma   . Foot drop, right   . Hypertension   . Sciatica of right side   . Sleep apnea    negative in 2018 but has a pulmonary appointment in the near furture    Past Surgical History   Past Surgical History:  Procedure Laterality Date  . BACK SURGERY     about 10  . CARPAL TUNNEL RELEASE Bilateral   . CERVICAL FUSION    . KNEE ARTHROSCOPY Left    two surgeries  . MULTIPLE TOOTH EXTRACTIONS    . ROTATOR CUFF REPAIR    . SPINE SURGERY     13 surgeries    Social History   Social History  Substance Use Topics  . Smoking status: Current Every Day Smoker    Packs/day: 0.50    Types: Cigarettes  . Smokeless tobacco: Never Used  . Alcohol use No    Medications   Prior to Admission medications   Medication Sig Start Date End Date Taking? Authorizing Provider  albuterol (ACCUNEB)  1.25 MG/3ML nebulizer solution Take 3 mLs (1.25 mg total) by nebulization every 6 (six) hours as needed for wheezing. 04/25/16  Yes Claretta Fraise, MD  albuterol (VENTOLIN HFA) 108 (90 Base) MCG/ACT inhaler Inhale 2 puffs into the lungs every 6 (six) hours as needed for wheezing or shortness of breath. 04/25/16  Yes Stacks, Cletus Gash, MD  budesonide-formoterol (SYMBICORT) 160-4.5 MCG/ACT inhaler Inhale 2 puffs into the lungs 2 (two) times daily. For COPD/Asthma 01/20/16  Yes Stacks, Cletus Gash, MD  cloNIDine (CATAPRES) 0.1 MG tablet TAKE  (1)  TABLET TWICE A DAY FOR HIGH BLOOD PRESSURE. 11/16/16  Yes Stacks, Cletus Gash, MD  ketoconazole (NIZORAL) 2 % cream Apply 1 application topically 2 (two) times daily. For rash Patient taking differently:  Apply 1 application topically daily as needed (rash).  05/24/16  Yes Stacks, Cletus Gash, MD  ketoconazole (NIZORAL) 2 % shampoo Apply 1 application topically 3 (three) times a week. Shampoo in as usual, allow to stay on scalp for 5 min. Before rinsing 05/23/16  Yes Stacks, Cletus Gash, MD  mirtazapine (REMERON) 30 MG tablet TAKE 1 TABLET AT BEDTIME AS NEEDED FOR SLEEP AND MOOD Patient taking differently: Take 30 mg by mouth at bedtime.  11/16/16  Yes Claretta Fraise, MD  Multiple Vitamin (MULTIVITAMIN WITH MINERALS) TABS tablet Take 2 tablets by mouth daily.   Yes [provider]  Oxycodone HCl 10 MG TABS Take 1 tablet (10 mg total) by mouth 3 (three) times daily as needed (for severe pain only). 02/23/16  Yes Claretta Fraise, MD  Respiratory Therapy Supplies (NEBULIZER/TUBING/MOUTHPIECE) KIT Use with nebulizer QID 04/25/16  Yes Stacks, Cletus Gash, MD  tapentadol (NUCYNTA ER) 100 MG 12 hr tablet Take 100 mg by mouth every 12 (twelve) hours.   Yes [provider]  vitamin C (ASCORBIC ACID) 500 MG tablet Take 1,000 mg by mouth daily.   Yes [provider]  pramipexole (MIRAPEX) 0.125 MG tablet 1 three times daily for 1 wk, then 2 TID for a week, then 3 TID for leg cramps Patient not taking: Reported on 11/25/2016 05/24/16   Claretta Fraise, MD    Allergies   Allergies  Allergen Reactions  . Codeine Hives and Shortness Of Breath  . Other Other (See Comments)    Tomatoes cause fever blisters.    Review of Systems  ROS  Neurologic Exam  Awake, alert, oriented Memory and concentration grossly intact Speech fluent, appropriate CN grossly intact Motor exam: Upper Extremities Deltoid Bicep Tricep Grip  Right 5/5 5/5 5/5 5/5  Left 5/5 5/5 5/5 5/5   Lower Extremities IP Quad PF DF EHL  Right 5/5 5/5 5/5 2/5 2/5  Left 5/5 5/5 5/5 5/5 5/5   Sensation grossly intact to LT  Imaging  MRI of the lumbar spine dated 05/30/2016 was reviewed. There is mild disc degeneration at L2-3 and L3-4,  with more marked degeneration at L4-5. At L2-3 there is mild left greater than right facet arthropathy with some joint diastasis. At L3-4 there is moderate right-sided lateral recess and foraminal stenosis. At L4-5 there is significant right-sided facet arthropathy with concomitant right eccentric disc bulge and resultant right-sided lateral recess and foraminal stenosis. At L5-S1 there is severe right-sided facet arthropathy with severe right lateral recess and foraminal stenosis.   Impression  51 year old man with chronic back and right greater than left leg pain. I suspect this is related to his spondylolisthesis, disc degeneration, and stenosis worst at L4-5 and L5-S1. He has undergone multiple prior treatments including multiple previous surgeries,  epidural steroid injections, and multiple rounds of physical therapy. In this situation, the only remaining option in my opinion would be decompression and fusion at L4-5 and L5-S1.  Plan  We will plan on proceeding with L4-5 L5-S1 interbody fusion with cortical pedicle screw stabilization   I have previously reviewed with the patient and his wife the details of the procedure, as well as the risks previously discussed including nerve injury, bleeding, infection, CSF leak, MI, stroke, DVT/PE. We also again reviewed the possibility of persistent or worsening pain.   The patient understood our discussion and is willing to proceed with surgical decompression and fusion. All questions were answered.

## 2016-12-06 NOTE — Anesthesia Preprocedure Evaluation (Addendum)
Anesthesia Evaluation  Patient identified by MRN, date of birth, ID band Patient awake    Reviewed: Allergy & Precautions, NPO status , Patient's Chart, lab work & pertinent test results  Airway Mallampati: I       Dental  (+) Edentulous Upper, Poor Dentition, Dental Advisory Given   Pulmonary Current Smoker,    Pulmonary exam normal breath sounds clear to auscultation       Cardiovascular hypertension, Normal cardiovascular exam Rhythm:Regular Rate:Normal     Neuro/Psych    GI/Hepatic negative GI ROS, Neg liver ROS,   Endo/Other  negative endocrine ROS  Renal/GU negative Renal ROS     Musculoskeletal   Abdominal (+) + obese,   Peds  Hematology negative hematology ROS (+)   Anesthesia Other Findings   Reproductive/Obstetrics                            Anesthesia Physical Anesthesia Plan  ASA: II  Anesthesia Plan: General   Post-op Pain Management:    Induction: Intravenous  PONV Risk Score and Plan: 2 and Ondansetron and Dexamethasone  Airway Management Planned: Oral ETT  Additional Equipment:   Intra-op Plan:   Post-operative Plan: Extubation in OR  Informed Consent: I have reviewed the patients History and Physical, chart, labs and discussed the procedure including the risks, benefits and alternatives for the proposed anesthesia with the patient or authorized representative who has indicated his/her understanding and acceptance.   Dental advisory given  Plan Discussed with: CRNA and Surgeon  Anesthesia Plan Comments:         Anesthesia Quick Evaluation

## 2016-12-06 NOTE — Progress Notes (Signed)
Orthopedic Tech Progress Note Patient Details:  Marlene Larderry Pore 09/10/1965 [id number] Patient has brace. Patient ID: Marlene Larderry Uptain, male   DOB: 07/15/1965, 51 y.o.   MRN: [id number]   Jennye MoccasinHughes, Dulcemaria Bula Craig 12/06/2016, 3:23 PM

## 2016-12-06 NOTE — Op Note (Addendum)
PREOP DIAGNOSIS:  1.  Lumbar degenerative disc disease, L4-5, L5-S1 2.  Spondylolisthesis L4-5, L5-S1  POSTOP DIAGNOSIS: Same  PROCEDURE: 1. L4, L5 laminectomy with facetectomy for decompression of exiting nerve roots, more than would be required for placement of interbody graft 2. Placement of anterior interbody device -Medtronic 7 mm expandable cage at L4-5, Medtronic expandable 8 mm cage at L5-S1 3. Posterior segmental instrumentation using cortical pedicle screws at L4-S1 4. Interbody arthrodesis, L4-5, L5-S1 5. Use of locally harvested bone autograft 6. Use of non-structural bone allograft -BMP  SURGEON: Dr. Lisbeth Renshaw, MD  ASSISTANT: Cindra Presume, PA-C  ANESTHESIA: General Endotracheal  EBL: 700c  SPECIMENS: None  DRAINS: None  COMPLICATIONS: None immediate  CONDITION: Hemodynamically stable to PACU  HISTORY: Jay Ortega is a 51 y.o. male who has been followed in the outpatient clinic with back and leg pain related to spondylosis with spondylolisthesis at L4-5 and L5-S1.  He had previously undergone multiple prior surgeries including resection of synovial cyst at these levels. He attempted multiple conservative treatments and ultimately elected to proceed with surgical decompression and fusion. Risks and benefits were reviewed and consent was obtained.  PROCEDURE IN DETAIL: After informed consent was obtained and witnessed, the patient was brought to the operating room. After induction of general anesthesia, the patient was positioned on the operative table in the prone position. All pressure points were meticulously padded. Incision was then marked out and prepped and draped in the usual sterile fashion.  After timeout was conducted, skin was infiltrated with local anesthetic. Skin incision was then made sharply and Bovie electrocautery was used to dissect the subcutaneous tissue until the lumbodorsal fascia was identified and incised.  There was a significant  amount of scar tissue adherent to the lamina at L4 and L5, as well as the top of the sacrum.  Scar was incised until the lateral aspect of the facet complex at L3-4, L4-5, and L5-S1 was identified.  Self-retaining retractor was placed.  Intraoperative fluoroscopy was used to confirm our location at the correct level.  At this point attention was turned to decompression. Complete laminectomy with facetectomy was completed bilaterally at L4 and L5.  I did note a significant amount of epidural fibrosis, especially on the patient's right side.  The thecal sac was completely decompressed, and the L4, L5, and S1 nerve roots were all visualized and completely decompressed dorsally.  Again, there was a significant amount of epidural fibrosis and scarring on the patient's right side which precluded dissection in the epidural space for discectomy, or placement of interbody grafts.  I therefore turned attention to discectomy on the on the patient's left side.  The epidural space was dissected at L4-5, during which a small pinhole durotomy was created.  The disc space was then incised, and discectomy was completed with a combination of curettes and rongeurs.  The endplates were then prepared with shavers.  The disc space was then packed with morselized bone autograft harvested during the decompression, and mixed with BMP.  7 mm expandable cage was then tapped into place, and expanded.  In a similar fashion, the epidural space at L5-S1 was dissected.  Discectomy was then completed in the same way with curettes and rongeurs.  Endplates were prepared, the interspace was packed with bone autograft with BMP, and an 8 mm expandable cage was then tapped into place and expanded.  Position of the interbody graft was confirmed with fluoroscopy.  At this point, the L4, L5, and S1 cortical pedicle  screws were drilled and tapped to 5.5 mm. Screws were then placed.  Position of the screws was confirmed with fluoroscopy.    The  small durotomy on the left lateral aspect of the thecal sac just inferior to the L4-5 disc space was covered with a small piece of DuraGen and Adherus dural sealant.   Rod was then placed, set screws placed and final tightened. Final AP and lateral fluoroscopic images confirmed good position.  The wound was then irrigated with copious amounts of antibiotic saline, then closed in standard fashion using a combination of interrupted 0 and 3-0 Vicryl stitches in the muscular, fascial, and subcutaneous layers. Skin was then closed using standard Dermabond. Sterile dressing was then applied. The patient was then transferred to the stretcher, extubated, and taken to the postanesthesia care unit in stable hemodynamic condition.  At the end of the case all sponge, needle, cottonoid, and instrument counts were correct.

## 2016-12-06 NOTE — Transfer of Care (Signed)
Immediate Anesthesia Transfer of Care Note  Patient: Jay Ortega  Procedure(s) Performed: Lumbar Four- Five Lumbar Five-Sacral One Posterior lumbar interbody fusion (N/A )  Patient Location: PACU  Anesthesia Type:General  Level of Consciousness: drowsy and patient cooperative  Airway & Oxygen Therapy: Patient Spontanous Breathing and Patient connected to nasal cannula oxygen  Post-op Assessment: Report given to RN and Patient moving all extremities X 4  Post vital signs: Reviewed and stable  Last Vitals:  Vitals:   12/06/16 0557  BP: 131/84  Pulse: 82  Resp: 19  Temp: 36.6 C  SpO2: 97%    Last Pain:  Vitals:   12/06/16 0638  TempSrc:   PainSc: 8       Patients Stated Pain Goal: 3 (12/06/16 [id number])  Complications: No apparent anesthesia complications

## 2016-12-06 NOTE — Anesthesia Procedure Notes (Signed)
Procedure Name: Intubation Date/Time: 12/06/2016 8:03 AM Performed by: Sampson Si E Pre-anesthesia Checklist: Patient identified, Emergency Drugs available, Suction available and Patient being monitored Patient Re-evaluated:Patient Re-evaluated prior to induction Oxygen Delivery Method: Circle System Utilized Preoxygenation: Pre-oxygenation with 100% oxygen Induction Type: IV induction Ventilation: Mask ventilation without difficulty Laryngoscope Size: Mac and 3 Grade View: Grade I Tube type: Oral Tube size: 7.5 mm Number of attempts: 1 Airway Equipment and Method: Stylet and Oral airway Placement Confirmation: ETT inserted through vocal cords under direct vision,  positive ETCO2 and breath sounds checked- equal and bilateral Secured at: 22 cm Tube secured with: Tape Dental Injury: Teeth and Oropharynx as per pre-operative assessment
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
## 2016-12-07 LAB — BASIC METABOLIC PANEL
Anion gap: 7 (ref 5–15)
BUN: 14 mg/dL (ref 6–20)
CO2: 26 mmol/L (ref 22–32)
Calcium: 8.6 mg/dL — ABNORMAL LOW (ref 8.9–10.3)
Chloride: 104 mmol/L (ref 101–111)
Creatinine, Ser: 0.73 mg/dL (ref 0.61–1.24)
GFR calc Af Amer: >60 mL/min (ref >60)
GFR calc non Af Amer: >60 mL/min (ref >60)
Glucose, Bld: 124 mg/dL — ABNORMAL HIGH (ref 65–99)
Potassium: 3.8 mmol/L (ref 3.5–5.1)
Sodium: 137 mmol/L (ref 135–145)

## 2016-12-07 LAB — PROTIME-INR
INR: 0.94
Prothrombin Time: 12.5 seconds (ref 11.4–15.2)

## 2016-12-07 LAB — CBC
HCT: 36.2 % — ABNORMAL LOW (ref 39.0–52.0)
Hemoglobin: 11.8 g/dL — ABNORMAL LOW (ref 13.0–17.0)
MCH: 29.7 pg (ref 26.0–34.0)
MCHC: 32.6 g/dL (ref 30.0–36.0)
MCV: 91.2 fL (ref 78.0–100.0)
Platelets: 264 10*3/uL (ref 150–400)
RBC: 3.97 MIL/uL — ABNORMAL LOW (ref 4.22–5.81)
RDW: 13.4 % (ref 11.5–15.5)
WBC: 17.5 10*3/uL — ABNORMAL HIGH (ref 4.0–10.5)

## 2016-12-07 LAB — APTT: aPTT: 32 seconds (ref 24–36)

## 2016-12-07 MED ORDER — METHOCARBAMOL 500 MG PO TABS: 500.0000 mg | ORAL_TABLET | Freq: Three times a day (TID) | ORAL | 0 refills | Status: DC | PRN

## 2016-12-07 MED ORDER — OXYCODONE HCL 10 MG PO TABS: 10.0000 mg | ORAL_TABLET | Freq: Four times a day (QID) | ORAL | 0 refills | Status: DC | PRN

## 2016-12-07 MED FILL — Thrombin For Soln 20000 Unit: CUTANEOUS | Qty: 1 | Status: AC

## 2016-12-07 MED FILL — Thrombin For Soln Kit 20000 Unit: CUTANEOUS | Qty: 1 | Status: AC

## 2016-12-07 NOTE — Anesthesia Postprocedure Evaluation (Signed)
Anesthesia Post Note  Patient: Jay Ortega  Procedure(s) Performed: Lumbar Four- Five Lumbar Five-Sacral One Posterior lumbar interbody fusion (N/A )     Patient location during evaluation: PACU Anesthesia Type: General Level of consciousness: awake Pain management: pain level controlled Vital Signs Assessment: post-procedure vital signs reviewed and stable Respiratory status: spontaneous breathing Cardiovascular status: stable Postop Assessment: no apparent nausea or vomiting Anesthetic complications: no    Last Vitals:  Vitals:   12/07/16 0400 12/07/16 0828  BP: 127/75 (!) 154/87  Pulse: 92 91  Resp: 18 18  Temp: 36.9 C 36.9 C  SpO2: 98% 99%    Last Pain:  Vitals:   12/07/16 0828  TempSrc: Oral  PainSc:    Pain Goal: Patients Stated Pain Goal: 3 (12/07/16 0815)               Marshawn Ninneman JR,JOHN Susann GivensFRANKLIN

## 2016-12-07 NOTE — Progress Notes (Addendum)
No issues overnight.  Patient has appropriate back pain.  He reports increased range of motion and strength in the right foot compared to preop.  Voiding normally.  No positional headaches.  EXAM:  BP 127/75 (BP Location: Left Arm)   Pulse 92   Temp 98.4 F (36.9 C) (Oral)   Resp 18   Ht 5\' 6"  (1.676 m)   Wt 95.3 kg (210 lb)   SpO2 98%   BMI 33.89 kg/m   Awake, alert, oriented  Speech fluent, appropriate  CN grossly intact  5/5 BUE/BLE x 3/5 DF/EHL Wound has a small amount of serosanguineous drainage from the inferior aspect.  No clear drainage  IMPRESSION:  51 y.o. male postop day #1 from L4-5 L5-S1 decompression and fusion, doing well.  PLAN: -We will discharge home today

## 2016-12-07 NOTE — Progress Notes (Signed)
Pt doing well. Pt and wife given D/C instructions with Rx's, verbal understanding was provided. Pt's incision is clean and dry with no sign of infection. Pt's IV was removed prior to D/C. Pt D/C'd home via wheelchair @ 0940 per MD order. Pt is stable @ D/C and has no other needs at this time. Rema FendtAshley Woodroe Vogan, RN

## 2016-12-07 NOTE — Progress Notes (Signed)
Physical Therapy Treatment Patient Details Name: Jay Ortega MRN: [id number] DOB: 02/04/1966 Today's Date: 12/07/2016    History of Present Illness Pt is a 51 y/o male s/p L4-S1 PLIF. PMH includes back surgery (11 prior to today's), arthritis, R foot drop, HTN, OSA, and bilat carpal tunnel release    PT Comments    Pt progressing towards physical therapy goals. Pt grossly antalgic with functional mobility however does not require assistance. Pt reports foot drop is improved however continue to note decreased dorsiflexion bilaterally. Pt reports feeling comfortable returning home today. Will continue to follow and progress as able per POC.   Follow Up Recommendations  No PT follow up;Supervision for mobility/OOB     Equipment Recommendations  None recommended by PT    Recommendations for Other Services       Precautions / Restrictions Precautions Precautions: Back Precaution Booklet Issued: Yes (comment) Precaution Comments: Reviewed back precautions with pt. Pt very familiar with precautions as he has had 12 before this one.  Required Braces or Orthoses: Spinal Brace Spinal Brace: Lumbar corset;Applied in standing position Restrictions Weight Bearing Restrictions: No    Mobility  Bed Mobility               General bed mobility comments: Pt was recevied sitting up in recliner.   Transfers Overall transfer level: Needs assistance Equipment used: Straight cane Transfers: Sit to/from Stand Sit to Stand: Min guard         General transfer comment: Min guard for safety and steadying assist   Ambulation/Gait Ambulation/Gait assistance: Min guard Ambulation Distance (Feet): 250 Feet Assistive device: Straight cane Gait Pattern/deviations: Step-through pattern;Decreased stride length;Decreased dorsiflexion - right (increased step height on the R. ) Gait velocity: Decreased  Gait velocity interpretation: Below normal speed for age/gender General Gait Details:  Decreased gait speed likely due to pain. Pt reports foot drop is improved this session however continue to note decreased DF bilaterally. Grossly antalgic which may be near baseline.    Stairs Stairs:  (Pt declined stair training)          Wheelchair Mobility    Modified Rankin (Stroke Patients Only)       Balance Overall balance assessment: Needs assistance Sitting-balance support: No upper extremity supported;Feet supported Sitting balance-Leahy Scale: Good     Standing balance support: Single extremity supported;During functional activity Standing balance-Leahy Scale: Fair                              Cognition Arousal/Alertness: Awake/alert Behavior During Therapy: WFL for tasks assessed/performed Overall Cognitive Status: Within Functional Limits for tasks assessed                                        Exercises      General Comments General comments (skin integrity, edema, etc.): Pt's wife and son present during evaluation and participated in all education      Pertinent Vitals/Pain Pain Assessment: Faces Pain Score: 8  Faces Pain Scale: Hurts whole lot Pain Location: back  Pain Descriptors / Indicators: Stabbing;Sharp;Constant;Operative site guarding Pain Intervention(s): Limited activity within patient's tolerance;Monitored during session;Repositioned    Home Living Family/patient expects to be discharged to:: Private residence Living Arrangements: Spouse/significant other Available Help at Discharge: Family;Available 24 hours/day Type of Home: House Home Access: Stairs to enter Entrance Stairs-Rails: None Home Layout: One level  Home Equipment: Shower seat - built in;Cane - single point      Prior Function Level of Independence: Independent with assistive device(s)      Comments: Used cane at baseline    PT Goals (current goals can now be found in the care plan section) Acute Rehab PT Goals Patient Stated Goal: to  go home  PT Goal Formulation: With patient Time For Goal Achievement: 12/13/16 Potential to Achieve Goals: Good Progress towards PT goals: Progressing toward goals    Frequency    Min 5X/week      PT Plan Current plan remains appropriate    Co-evaluation              AM-PAC PT "6 Clicks" Daily Activity  Outcome Measure  Difficulty turning over in bed (including adjusting bedclothes, sheets and blankets)?: None Difficulty moving from lying on back to sitting on the side of the bed? : None Difficulty sitting down on and standing up from a chair with arms (e.g., wheelchair, bedside commode, etc,.)?: A Little Help needed moving to and from a bed to chair (including a wheelchair)?: A Little Help needed walking in hospital room?: A Little Help needed climbing 3-5 steps with a railing? : A Little 6 Click Score: 20    End of Session Equipment Utilized During Treatment: Gait belt;Back brace Activity Tolerance: Patient tolerated treatment well Patient left: in bed;with call bell/phone within reach Nurse Communication: Mobility status PT Visit Diagnosis: Other abnormalities of gait and mobility (R26.89);Pain Pain - part of body:  (back )     Time: 1610-9604 PT Time Calculation (min) (ACUTE ONLY): 10 min  Charges:  $Gait Training: 8-22 mins                    G Codes:       Conni Slipper, PT, DPT Acute Rehabilitation Services Pager: [phone]    Marylynn Pearson 12/07/2016, 11:02 AM

## 2016-12-07 NOTE — Therapy (Signed)
Occupational Therapy Evaluation Patient Details Name: Jay Ortega MRN: [id number] DOB: Jan 10, 1966 Today's Date: 12/07/2016    History of Present Illness Pt is a 51 y/o male s/p L4-S1 PLIF. PMH includes back surgery (11 prior to today's), arthritis, R foot drop, HTN, OSA, and bilat carpal tunnel release   Clinical Impression   Pt reports being independent in ADLs and used a cane for functional mobility PTA. Currently, pt requries min assist for LB ADLs and min guard for functional mobility with a cane. Pt reports his wife is available to provide 24 hour assistance as needed upon d/c home. Began acute OT education on ADLs with back precautions. Pt is safe to d/c home with 24 hour supervision. OT will follow acutely while admitted to address established goals.     Follow Up Recommendations  No OT follow up;Supervision/Assistance - 24 hour    Equipment Recommendations  None recommended by OT    Recommendations for Other Services       Precautions / Restrictions Precautions Precautions: Back Precaution Booklet Issued: Yes (comment) Precaution Comments: Reviewed back precautions with pt. Pt very familiar with precautions as he has had 12 before this one.  Required Braces or Orthoses: Spinal Brace Spinal Brace: Lumbar corset;Applied in standing position Restrictions Weight Bearing Restrictions: No      Mobility Bed Mobility               General bed mobility comments: Pt sitting EOB upon arrival. Pt reports no concerns with log roll  Transfers Overall transfer level: Needs assistance Equipment used: Straight cane Transfers: Sit to/from Stand Sit to Stand: Min guard         General transfer comment: Min guard for safety and steadying assist     Balance Overall balance assessment: Needs assistance Sitting-balance support: No upper extremity supported;Feet supported Sitting balance-Leahy Scale: Good     Standing balance support: Single extremity supported;During  functional activity Standing balance-Leahy Scale: Fair                             ADL either performed or assessed with clinical judgement   ADL Overall ADL's : Needs assistance/impaired Eating/Feeding: Independent   Grooming: Supervision/safety;Set up;Sitting   Upper Body Bathing: Supervision/ safety;Set up;Sitting   Lower Body Bathing: Minimal assistance;Sit to/from stand   Upper Body Dressing : Supervision/safety;Set up;Sitting   Lower Body Dressing: Minimal assistance;Sit to/from stand   Toilet Transfer: Min guard;Ambulation Best boy) Statistician Details (indicate cue type and reason): Simulated sit to stand from EOB          Functional mobility during ADLs: Min guard;Cane General ADL Comments: Pt educated on compensatory techniques for LB ADLs with back precautions. Pt currently unable to bring feet over knees for LB ADLs. Pts wife agreeable to assist as needed upon d/c. Pt educated on AE for pericare and reports that was not a difficulty in his past back operations.      Vision         Perception     Praxis      Pertinent Vitals/Pain Pain Assessment: 0-10 Pain Score: 8  Pain Location: back  Pain Descriptors / Indicators: Stabbing;Sharp;Constant;Operative site guarding Pain Intervention(s): Limited activity within patient's tolerance;Monitored during session;Ice applied     Hand Dominance     Extremity/Trunk Assessment Upper Extremity Assessment Upper Extremity Assessment: Overall WFL for tasks assessed   Lower Extremity Assessment Lower Extremity Assessment: Defer to PT evaluation   Cervical /  Trunk Assessment Cervical / Trunk Assessment: Other exceptions Cervical / Trunk Exceptions: s/p back surgery    Communication Communication Communication: No difficulties   Cognition Arousal/Alertness: Awake/alert Behavior During Therapy: WFL for tasks assessed/performed Overall Cognitive Status: Within Functional Limits for tasks assessed                                      General Comments  Pt's wife and son present during evaluation and participated in all education    Exercises     Shoulder Instructions      Home Living Family/patient expects to be discharged to:: Private residence Living Arrangements: Spouse/significant other Available Help at Discharge: Family;Available 24 hours/day Type of Home: House Home Access: Stairs to enter Enter[employer] CorporationEntrance Stairs-Number of Steps: 1 Entrance Stairs-Rails: None Home Layout: One level     Bathroom Shower/Tub: Walk-in Pensions consultantshower;Curtain   Bathroom Toilet: Handicapped height Bathroom Accessibility: Yes How Accessible: Accessible via walker Home Equipment: Shower seat - built in;Cane - single point          Prior Functioning/Environment Level of Independence: Independent with assistive device(s)        Comments: Used cane at baseline         OT Problem List: Pain;Decreased knowledge of precautions;Decreased activity tolerance      OT Treatment/Interventions: Self-care/ADL training;DME and/or AE instruction;Therapeutic activities;Patient/family education    OT Goals(Current goals can be found in the care plan section) Acute Rehab OT Goals Patient Stated Goal: to go home  OT Goal Formulation: All assessment and education complete, DC therapy  OT Frequency:     Barriers to D/C:            Co-evaluation              AM-PAC PT "6 Clicks" Daily Activity     Outcome Measure Help from another person eating meals?: None Help from another person taking care of personal grooming?: None Help from another person toileting, which includes using toliet, bedpan, or urinal?: A Little Help from another person bathing (including washing, rinsing, drying)?: A Little Help from another person to put on and taking off regular upper body clothing?: None Help from another person to put on and taking off regular lower body clothing?: A Little 6 Click Score: 21    End of Session Equipment Utilized During Treatment: Gait belt Gilmer Mor(Cane) Nurse Communication: Mobility status;Patient requests pain meds  Activity Tolerance: Patient tolerated treatment well Patient left: in chair;with call bell/phone within reach;with family/visitor present  OT Visit Diagnosis: Other abnormalities of gait and mobility (R26.89);Pain Pain - part of body:  (Back)                Time: [id number] OT Time Calculation (min): 23 min Charges:    G-Codes:     Cammy Copaourtney Salmaan Patchin, OTS [phone]   Cammy Copaourtney Lovette Merta 12/07/2016, 8:29 AM

## 2016-12-07 NOTE — Progress Notes (Signed)
OT Note Addendum for Charges    12/07/16 0817  OT Time Calculation  OT Start Time (ACUTE ONLY) 0752  OT Stop Time (ACUTE ONLY) 0815  OT Time Calculation (min) 23 min  OT General Charges  $OT Visit 1 Visit  OT Evaluation  $OT Eval Moderate Complexity 1 Mod  OT Treatments  $Self Care/Home Management  8-22 mins   Marvalene Barrett A. Brett Albinooffey, M.S., OTR/L Pager: [phone]

## 2016-12-07 NOTE — Discharge Summary (Signed)
Physician Discharge Summary  Patient ID: Jay Ortega MRN: [id number] DOB/AGE: 1965/10/24 51 y.o.  Admit date: 12/06/2016 Discharge date: 12/07/2016  Admission Diagnoses:  Lumbar radiculopathy Spondylolisthesis L4-5 L5-S1  Discharge Diagnoses:  Same Active Problems:   Lumbar radiculopathy   Discharged Condition: Stable  Hospital Course:  Jay Ortega is a 51 y.o. male admitted electively after uncomplicated U8-2 C0-K3 lumbar decompression and fusion.  He was monitored postoperatively in the spine care unit.  He was noted to be ambulating without difficulty, voiding normally, and tolerating diet.  He was reporting improved strength in the right foot.  Treatments: Surgery -posterior lumbar interbody fusion L4-5, L5-S1  Discharge Exam: Blood pressure 127/75, pulse 92, temperature 98.4 F (36.9 C), temperature source Oral, resp. rate 18, height 5' 6"  (1.676 m), weight 95.3 kg (210 lb), SpO2 98 %. Awake, alert, oriented Speech fluent, appropriate CN grossly intact 5/5 BUE/BLE x 3/5 right DF/EHL Wound c/d/i  Disposition: Home  Discharge Instructions    Call MD for:  redness, tenderness, or signs of infection (pain, swelling, redness, odor or green/yellow discharge around incision site)    Complete by:  As directed    Call MD for:  temperature >100.4    Complete by:  As directed    Diet - low sodium heart healthy    Complete by:  As directed    Discharge instructions    Complete by:  As directed    Walk at home as much as possible, at least 4 times / day   Incentive spirometry RT    Complete by:  As directed    Increase activity slowly    Complete by:  As directed    Lifting restrictions    Complete by:  As directed    No lifting > 10 lbs   May shower / Bathe    Complete by:  As directed    48 hours after surgery   May walk up steps    Complete by:  As directed    No dressing needed    Complete by:  As directed    Other Restrictions    Complete by:  As directed     No bending/twisting at waist     Allergies as of 12/07/2016      Reactions   Codeine Hives, Shortness Of Breath   Other Other (See Comments)   Tomatoes cause fever blisters.      Medication List    STOP taking these medications   pramipexole 0.125 MG tablet Commonly known as:  MIRAPEX     TAKE these medications   albuterol 1.25 MG/3ML nebulizer solution Commonly known as:  ACCUNEB Take 3 mLs (1.25 mg total) by nebulization every 6 (six) hours as needed for wheezing.   albuterol 108 (90 Base) MCG/ACT inhaler Commonly known as:  VENTOLIN HFA Inhale 2 puffs into the lungs every 6 (six) hours as needed for wheezing or shortness of breath.   budesonide-formoterol 160-4.5 MCG/ACT inhaler Commonly known as:  SYMBICORT Inhale 2 puffs into the lungs 2 (two) times daily. For COPD/Asthma   cloNIDine 0.1 MG tablet Commonly known as:  CATAPRES TAKE  (1)  TABLET TWICE A DAY FOR HIGH BLOOD PRESSURE.   ketoconazole 2 % shampoo Commonly known as:  NIZORAL Apply 1 application topically 3 (three) times a week. Shampoo in as usual, allow to stay on scalp for 5 min. Before rinsing What changed:  Another medication with the same name was changed. Make sure you understand how and when  to take each.   ketoconazole 2 % cream Commonly known as:  NIZORAL Apply 1 application topically 2 (two) times daily. For rash What changed:  when to take this  reasons to take this  additional instructions   methocarbamol 500 MG tablet Commonly known as:  ROBAXIN Take 1 tablet (500 mg total) by mouth every 8 (eight) hours as needed for muscle spasms.   mirtazapine 30 MG tablet Commonly known as:  REMERON TAKE 1 TABLET AT BEDTIME AS NEEDED FOR SLEEP AND MOOD What changed:  how much to take  how to take this  when to take this  additional instructions   multivitamin with minerals Tabs tablet Take 2 tablets by mouth daily.   Nebulizer/Tubing/Mouthpiece Kit Use with nebulizer QID    NUCYNTA ER 100 MG 12 hr tablet Generic drug:  tapentadol Take 100 mg by mouth every 12 (twelve) hours.   Oxycodone HCl 10 MG Tabs Take 1 tablet (10 mg total) by mouth 3 (three) times daily as needed (for severe pain only). What changed:  Another medication with the same name was added. Make sure you understand how and when to take each.   Oxycodone HCl 10 MG Tabs Take 1 tablet (10 mg total) by mouth every 6 (six) hours as needed for severe pain ((score 7 to 10)). What changed:  You were already taking a medication with the same name, and this prescription was added. Make sure you understand how and when to take each.   vitamin C 500 MG tablet Commonly known as:  ASCORBIC ACID Take 1,000 mg by mouth daily.      Follow-up Information    Consuella Lose, MD Follow up in 3 week(s).   Specialty:  Neurosurgery Contact information: [address] [place] [address] [phone]           Signed: Consuella Lose, Loletha Grayer 12/07/2016, 8:25 AM

## 2016-12-13 ENCOUNTER — Other Ambulatory Visit: Payer: Self-pay | Admitting: Family Medicine

## 2017-01-02 ENCOUNTER — Institutional Professional Consult (permissible substitution): Payer: Medicaid Other | Admitting: Pulmonary Disease

## 2017-01-10 ENCOUNTER — Ambulatory Visit: Payer: Medicaid Other | Admitting: Family Medicine

## 2017-01-10 ENCOUNTER — Encounter: Payer: Self-pay | Admitting: Family Medicine

## 2017-01-10 VITALS — BP 127/83 | HR 100 | Temp 98.0°F | Ht 66.0 in | Wt 212.0 lb

## 2017-01-10 DIAGNOSIS — J4541 Moderate persistent asthma with (acute) exacerbation: Secondary | ICD-10-CM | POA: Diagnosis not present

## 2017-01-10 DIAGNOSIS — I10 Essential (primary) hypertension: Secondary | ICD-10-CM | POA: Diagnosis not present

## 2017-01-10 DIAGNOSIS — M961 Postlaminectomy syndrome, not elsewhere classified: Secondary | ICD-10-CM | POA: Diagnosis not present

## 2017-01-10 DIAGNOSIS — G2581 Restless legs syndrome: Secondary | ICD-10-CM

## 2017-01-10 NOTE — Progress Notes (Signed)
Subjective:  Patient ID: Jay Ortega, male    DOB: 10-19-1965  Age: 51 y.o. MRN: [id number]  CC: Follow-up (pt here today for routine follow up of his chronic medical conditions)   HPI Jay Ortega presents for  follow-up of hypertension. Patient has no history of headache chest pain or shortness of breath or recent cough. Patient also denies symptoms of TIA such as focal numbness or weakness. Patient checks  blood pressure at home. Readings recently good. Patient denies side effects from medication. States taking it regularly.  Also patient says he started a cold yesterday.  He is using his Symbicort and his albuterol nebs.  He is not really short of breath he does have some cough.  He was given a Z-Pak this morning when he saw his pain doctor.  He is under treatment for his postlaminectomy syndrome and lumbar radiculopathy and both are doing well on his current vent opiate regimen administered by his pain physician.  History Jay Ortega has a past medical history of Arthritis, Asthma, Foot drop, right, Hypertension, Sciatica of right side, and Sleep apnea.   He has a past surgical history that includes Spine surgery; Rotator cuff repair; Carpal tunnel release (Bilateral); Knee arthroscopy (Left); Cervical fusion; Back surgery; and Multiple tooth extractions.   His family history includes Cancer in his father; Diabetes in his mother.He reports that he has been smoking cigarettes.  He has been smoking about 0.50 packs per day. he has never used smokeless tobacco. He reports that he does not drink alcohol or use drugs.  Current Outpatient Medications on File Prior to Visit  Medication Sig Dispense Refill  . albuterol (ACCUNEB) 1.25 MG/3ML nebulizer solution Take 3 mLs (1.25 mg total) by nebulization every 6 (six) hours as needed for wheezing. 125 vial 11  . albuterol (VENTOLIN HFA) 108 (90 Base) MCG/ACT inhaler Inhale 2 puffs into the lungs every 6 (six) hours as needed for wheezing or shortness of  breath. 1 Inhaler 11  . budesonide-formoterol (SYMBICORT) 160-4.5 MCG/ACT inhaler Inhale 2 puffs into the lungs 2 (two) times daily. For COPD/Asthma 3 Inhaler 3  . cloNIDine (CATAPRES) 0.1 MG tablet TAKE  (1)  TABLET TWICE A DAY FOR HIGH BLOOD PRESSURE. 60 tablet 0  . ketoconazole (NIZORAL) 2 % cream Apply 1 application topically 2 (two) times daily. For rash (Patient taking differently: Apply 1 application topically daily as needed (rash). ) 60 g 5  . ketoconazole (NIZORAL) 2 % shampoo Apply 1 application topically 3 (three) times a week. Shampoo in as usual, allow to stay on scalp for 5 min. Before rinsing 120 mL 11  . mirtazapine (REMERON) 30 MG tablet TAKE 1 TABLET AT BEDTIME AS NEEDED FOR SLEEP AND MOOD 30 tablet 0  . Multiple Vitamin (MULTIVITAMIN WITH MINERALS) TABS tablet Take 2 tablets by mouth daily.    Marland Kitchen Respiratory Therapy Supplies (NEBULIZER/TUBING/MOUTHPIECE) KIT Use with nebulizer QID 30 each 11  . tapentadol (NUCYNTA ER) 100 MG 12 hr tablet Take 100 mg by mouth every 12 (twelve) hours.    . vitamin C (ASCORBIC ACID) 500 MG tablet Take 1,000 mg by mouth daily.     No current facility-administered medications on file prior to visit.     ROS Review of Systems  Constitutional: Negative for chills, diaphoresis and fever.  HENT: Negative for rhinorrhea and sore throat.   Respiratory: Negative for cough and shortness of breath.   Cardiovascular: Negative for chest pain.  Gastrointestinal: Negative for abdominal pain.  Musculoskeletal: Negative for  arthralgias and myalgias.  Skin: Negative for rash.  Neurological: Negative for weakness and headaches.    Objective:  BP 127/83   Pulse 100   Temp 98 F (36.7 C) (Oral)   Ht _0  (1.676 m)   Wt 212 lb (96.2 kg)   BMI 34.22 kg/m   BP Readings from Last 3 Encounters:  01/10/17 127/83  12/07/16 (!) 154/87  11/30/16 132/85    Wt Readings from Last 3 Encounters:  01/10/17 212 lb (96.2 kg)  12/06/16 210 lb (95.3 kg)    11/30/16 212 lb 9.6 oz (96.4 kg)     Physical Exam  Constitutional: He is oriented to person, place, and time. He appears well-developed and well-nourished.  HENT:  Head: Normocephalic and atraumatic.  Right Ear: External ear normal.  Left Ear: External ear normal.  Mouth/Throat: No oropharyngeal exudate or posterior oropharyngeal erythema.  Eyes: Pupils are equal, round, and reactive to light.  Neck: Normal range of motion. Neck supple.  Cardiovascular: Normal rate and regular rhythm.  No murmur heard. Pulmonary/Chest: No respiratory distress. He has wheezes (Rhonchi all fields).  Neurological: He is alert and oriented to person, place, and time.  Vitals reviewed.     Assessment & Plan:   Jay was seen today for follow-up.  Diagnoses and all orders for this visit:  Benign essential HTN  Moderate persistent asthma with acute exacerbation  Restless leg syndrome  Postlaminectomy syndrome, lumbar region   Allergies as of 01/10/2017      Reactions   Codeine Hives, Shortness Of Breath   Other Other (See Comments)   Tomatoes cause fever blisters.      Medication List        Accurate as of 01/10/17  2:39 PM. Always use your most recent med list.          albuterol 1.25 MG/3ML nebulizer solution Commonly known as:  ACCUNEB Take 3 mLs (1.25 mg total) by nebulization every 6 (six) hours as needed for wheezing.   albuterol 108 (90 Base) MCG/ACT inhaler Commonly known as:  VENTOLIN HFA Inhale 2 puffs into the lungs every 6 (six) hours as needed for wheezing or shortness of breath.   budesonide-formoterol 160-4.5 MCG/ACT inhaler Commonly known as:  SYMBICORT Inhale 2 puffs into the lungs 2 (two) times daily. For COPD/Asthma   cloNIDine 0.1 MG tablet Commonly known as:  CATAPRES TAKE  (1)  TABLET TWICE A DAY FOR HIGH BLOOD PRESSURE.   ketoconazole 2 % shampoo Commonly known as:  NIZORAL Apply 1 application topically 3 (three) times a week. Shampoo in as  usual, allow to stay on scalp for 5 min. Before rinsing   ketoconazole 2 % cream Commonly known as:  NIZORAL Apply 1 application topically 2 (two) times daily. For rash   mirtazapine 30 MG tablet Commonly known as:  REMERON TAKE 1 TABLET AT BEDTIME AS NEEDED FOR SLEEP AND MOOD   multivitamin with minerals Tabs tablet Take 2 tablets by mouth daily.   Nebulizer/Tubing/Mouthpiece Kit Use with nebulizer QID   NUCYNTA ER 100 MG 12 hr tablet Generic drug:  tapentadol Take 100 mg by mouth every 12 (twelve) hours.   vitamin C 500 MG tablet Commonly known as:  ASCORBIC ACID Take 1,000 mg by mouth daily.       No orders of the defined types were placed in this encounter.  Patient encouraged to use the Z-Pak.  Continue medications as is.  Unfortunately he was wheezing just enough today that I do  not think a flu shot is appropriate.  I like him to follow-up in 3-4 days.  If the wheezing has remitted he can have his flu shot then.  I did encourage him to get it as soon as feasible.  As per usual he was encouraged to discontinue smoking. Follow-up: Return in about 6 months (around 07/10/2017).  Claretta Fraise, M.D.

## 2017-01-14 ENCOUNTER — Other Ambulatory Visit: Payer: Self-pay | Admitting: Family Medicine

## 2017-01-31 ENCOUNTER — Institutional Professional Consult (permissible substitution): Payer: Medicaid Other | Admitting: Pulmonary Disease

## 2017-02-09 ENCOUNTER — Ambulatory Visit: Payer: Medicaid Other | Attending: Neurosurgery | Admitting: Physical Therapy

## 2017-02-09 ENCOUNTER — Encounter: Payer: Self-pay | Admitting: Physical Therapy

## 2017-02-09 DIAGNOSIS — M6281 Muscle weakness (generalized): Secondary | ICD-10-CM

## 2017-02-09 DIAGNOSIS — M5440 Lumbago with sciatica, unspecified side: Secondary | ICD-10-CM | POA: Diagnosis present

## 2017-02-09 DIAGNOSIS — G8929 Other chronic pain: Secondary | ICD-10-CM | POA: Diagnosis present

## 2017-02-09 NOTE — Therapy (Signed)
[facility] [address] [place], [place], [address] Phone: [phone]   Fax:  [phone]  Physical Therapy Evaluation  Patient Details  Name: Jay Ortega MRN: [id number] Date of Birth: 12/12/1965 Referring Provider: Lisbeth RenshawNeelesh Nundkumar MD.   Encounter Date: 02/09/2017  PT End of Session - 02/09/17 1412    Visit Number  1    Number of Visits  15    Date for PT Re-Evaluation  04/06/17    PT Start Time  0142    PT Stop Time  0211    PT Time Calculation (min)  29 min    Activity Tolerance  Patient tolerated treatment well    Behavior During Therapy  [facility] for tasks assessed/performed       Past Medical History:  Diagnosis Date  . Arthritis    back, hand  . Asthma   . Foot drop, right   . Hypertension   . Sciatica of right side   . Sleep apnea    negative in 2018 but has a pulmonary appointment in the near furture    Past Surgical History:  Procedure Laterality Date  . BACK SURGERY     about 12  . CARPAL TUNNEL RELEASE Bilateral   . CERVICAL FUSION    . KNEE ARTHROSCOPY Left    two surgeries  . MULTIPLE TOOTH EXTRACTIONS    . ROTATOR CUFF REPAIR    . SPINE SURGERY     13 surgeries    There were no vitals filed for this visit.   Subjective Assessment - 02/09/17 1415    Subjective  The patient presents to OPPT with c/o low back pain and pain radiating into both LE's.  He has a vast history regarding chronic pain and reports having had 12 spinal surgeries.  He walks with a cane for safety.  He has right foot drop and use to use an AFO but no longer likes using it.  His pain is a 7/10 today with pain increasing with "everything."  Rest can decrease his pain a bit.    Pertinent History  12 spinal surgeries; right foot drop.    Limitations  Sitting;Standing;Walking    How long can you sit comfortably?  5-10 minutes.    How long can you stand comfortably?  5 minutes.    How long can you walk comfortably?  Short distances  with cane.    Patient Stated Goals  Would like to get pain down some.    Currently in Pain?  Yes    Pain Score  7     Pain Location  Back    Pain Orientation  Mid;Lower    Pain Descriptors / Indicators  Aching;Throbbing    Pain Type  Chronic pain    Pain Radiating Towards  Bilateral LE's.    Pain Onset  More than a month ago    Pain Frequency  Constant    Aggravating Factors   See above.    Pain Relieving Factors  See above.    Effect of Pain on Daily Activities  Impaired ability to perform ADL's.         [facility] - 02/09/17 0001      Assessment   Medical Diagnosis  Spondylolthesis L4-L5.    Referring Provider  Lisbeth RenshawNeelesh Nundkumar MD.    Onset Date/Surgical Date  -- Many years.      Precautions   Precautions  -- Avoid spinal loading.      Restrictions   Weight  Bearing Restrictions  No      Balance Screen   Has the patient fallen in the past 6 months  No    Has the patient had a decrease in activity level because of a fear of falling?   Yes    Is the patient reluctant to leave their home because of a fear of falling?   No      Home Public house managernvironment   Living Environment  Private residence      Prior Function   Level of Independence  Independent with basic ADLs      Posture/Postural Control   Posture/Postural Control  Postural limitations    Postural Limitations  Decreased lumbar lordosis      ROM / Strength   AROM / PROM / Strength  AROM;Strength      AROM   Overall AROM Comments  Right ankle limited to neutral dorsiflexion.  Right hip flexion in supine= 105 degrees and left= 115 degrees.      Strength   Overall Strength Comments  Right ankle dorsiflexion not graded higher than 1/5.  Bilateral knee strength is a normal 5/5.  Left ankle strength= 5/5.      Palpation   Palpation comment  Pain reported "in" spine at lower lumbar region.      Special Tests    Special Tests  Lumbar;Leg LengthTest RT PAT DTR 1+/4+;LT=2+/4+ and absent Achille's DTR's bil.    Leg  length test   -- (=) leg lengths.      Transfers   Comments  HHA provided for supine to sit transfer.  Ind with armrests from chair for sit to stand transfers.      Ambulation/Gait   Gait Comments  Steppage gait on right to avoid toe drag using a straight cane.             Objective measurements completed on examination: See above findings.                   PT Long Term Goals - 02/09/17 1433      PT LONG TERM GOAL #1   Title  Ind with a HEP.    Baseline  No knowledge of appropriate ther ex.    Time  8    Period  Weeks    Status  New      PT LONG TERM GOAL #2   Title  Sit 20 minutes with pain not > 4-5/10.    Baseline  Pain easliy rise to a 7+/10.    Time  8    Period  Weeks    Status  New      PT LONG TERM GOAL #3   Title  Stand 20 minutes with pain not > 4-5/10.    Baseline  Pain easliy rise to a 7+/10.    Time  8    Period  Weeks    Status  New      PT LONG TERM GOAL #4   Title  Perform ADL's with pain not > 4-5/10.    Baseline  Pain easliy rise to a 7+/10.    Time  8    Period  Weeks    Status  New             Plan - 02/09/17 1427    Clinical Impression Statement  The patient presents to OPPT with chronic low back pain having undergone a lumbar fusion surgery L4-5 and L5-S1 performed on 12/06/16.  He is in a  lot of pain and his deficits impair his functional movement.  He states he has had 12 spinal surgeries.  He was right foot drop and a compensatory steppage gait to avoid a right toe drag.  He required HHA to transfer from supine to sit today.  The patient would benefit from progressionintoa backj stabilization exercise program.    History and Personal Factors relevant to plan of care:  12 spinal surgeries    Clinical Presentation  Evolving    Clinical Presentation due to:  Pain unrelenting and chronicity.    Clinical Decision Making  Moderate    Rehab Potential  Fair    Clinical Impairments Affecting Rehab Potential  Chronicity  and multiple spinal surgeries.    PT Frequency  -- 3 times for first two weeks per authorization and then 2 times a week for 6 weeks.    PT Treatment/Interventions  ADLs/Self Care Home Management;Cryotherapy;Electrical Stimulation;Ultrasound;Therapeutic activities;Therapeutic exercise;Functional mobility training;Patient/family education;Manual techniques    PT Next Visit Plan  SKTC; draw-in progression.  Please refer to lumbar fusion protocol.  Modalites PRN.    Consulted and Agree with Plan of Care  Patient       Patient will benefit from skilled therapeutic intervention in order to improve the following deficits and impairments:  Pain, Decreased activity tolerance, Decreased mobility, Abnormal gait, Postural dysfunction, Decreased strength, Decreased range of motion  Visit Diagnosis: Chronic midline low back pain with sciatica, sciatica laterality unspecified - Plan: PT plan of care cert/re-cert  Muscle weakness (generalized) - Plan: PT plan of care cert/re-cert     Problem List Patient Active Problem List   Diagnosis Date Noted  . Lumbar radiculopathy 12/06/2016  . Benign essential HTN 02/23/2016  . Uncomplicated opioid dependence (HCC) 02/23/2016  . Chronic bilateral low back pain 02/23/2016  . Restless leg syndrome 02/23/2016  . Postlaminectomy syndrome, lumbar region 02/23/2016  . Asthma 01/22/2016    Iran Rowe, [place] MPT 02/09/2017, 2:37 PM  [facility] [address] [place], [place], [address] Phone: [phone]   Fax:  [phone]  Name: Jay Ortega MRN: [id number] Date of Birth: [birth year]

## 2017-02-13 ENCOUNTER — Other Ambulatory Visit: Payer: Self-pay | Admitting: Family Medicine

## 2017-02-24 ENCOUNTER — Encounter: Payer: Self-pay | Admitting: Physical Therapy

## 2017-02-24 ENCOUNTER — Ambulatory Visit: Payer: Medicaid Other | Attending: Neurosurgery | Admitting: Physical Therapy

## 2017-02-24 DIAGNOSIS — G8929 Other chronic pain: Secondary | ICD-10-CM | POA: Diagnosis present

## 2017-02-24 DIAGNOSIS — M6281 Muscle weakness (generalized): Secondary | ICD-10-CM

## 2017-02-24 DIAGNOSIS — M5440 Lumbago with sciatica, unspecified side: Secondary | ICD-10-CM | POA: Diagnosis present

## 2017-02-24 NOTE — Therapy (Addendum)
[facility] [place], [place], [address] Phone: [phone]   Fax:  [phone]  Physical Therapy Treatment  Patient Details  Name: Jay Ortega MRN: [id number] Date of Birth: September 04, 1965 Referring Provider: Consuella Lose MD.   Encounter Date: 02/24/2017  PT End of Session - 02/24/17 1001    Visit Number  2    Number of Visits  15    Date for PT Re-Evaluation  04/06/17    PT Start Time  0948    PT Stop Time  1025    PT Time Calculation (min)  37 min    Activity Tolerance  Patient limited by pain    Behavior During Therapy  [facility] for tasks assessed/performed       Past Medical History:  Diagnosis Date  . Arthritis    back, hand  . Asthma   . Foot drop, right   . Hypertension   . Sciatica of right side   . Sleep apnea    negative in 2018 but has a pulmonary appointment in the near furture    Past Surgical History:  Procedure Laterality Date  . BACK SURGERY     about 12  . CARPAL TUNNEL RELEASE Bilateral   . CERVICAL FUSION    . KNEE ARTHROSCOPY Left    two surgeries  . MULTIPLE TOOTH EXTRACTIONS    . ROTATOR CUFF REPAIR    . SPINE SURGERY     13 surgeries    There were no vitals filed for this visit.  Subjective Assessment - 02/24/17 1000    Subjective  Reports that he overdid it yesterday and is very sore from walking and hanging insulation in his garage.    Pertinent History  12 spinal surgeries; right foot drop.    Limitations  Sitting;Standing;Walking    How long can you sit comfortably?  5-10 minutes.    How long can you stand comfortably?  5 minutes.    How long can you walk comfortably?  Short distances with cane.    Patient Stated Goals  Would like to get pain down some.    Currently in Pain?  Yes    Pain Score  7     Pain Location  Generalized    Pain Descriptors / Indicators  Sore    Pain Type  Chronic pain    Pain Radiating Towards  BLEs    Pain Onset  More than a month ago          [facility] PT Assessment - 02/24/17 0001      Assessment   Medical Diagnosis  Spondylolthesis L4-L5.      Restrictions   Weight Bearing Restrictions  No                  [facility] Adult PT Treatment/Exercise - 02/24/17 0001      Exercises   Exercises  Lumbar      Lumbar Exercises: Aerobic   Nustep  L4 x13 min      Modalities   Modalities  Electrical Stimulation;Moist Heat in sitting       Moist Heat Therapy   Number Minutes Moist Heat  15 Minutes    Moist Heat Location  Lumbar Spine      Electrical Stimulation   Electrical Stimulation Location  B low back    Electrical Stimulation Action  IFC    Electrical Stimulation Parameters  80-150 hz x15 min    Electrical Stimulation Goals  Pain  PT Long Term Goals - 02/09/17 1433      PT LONG TERM GOAL #1   Title  Ind with a HEP.    Baseline  No knowledge of appropriate ther ex.    Time  8    Period  Weeks    Status  New      PT LONG TERM GOAL #2   Title  Sit 20 minutes with pain not > 4-5/10.    Baseline  Pain easliy rise to a 7+/10.    Time  8    Period  Weeks    Status  New      PT LONG TERM GOAL #3   Title  Stand 20 minutes with pain not > 4-5/10.    Baseline  Pain easliy rise to a 7+/10.    Time  8    Period  Weeks    Status  New      PT LONG TERM GOAL #4   Title  Perform ADL's with pain not > 4-5/10.    Baseline  Pain easliy rise to a 7+/10.    Time  8    Period  Weeks    Status  New            Plan - 02/24/17 1044    Clinical Impression Statement  Patient arrived to treatment with reports of generalized soreness from hanging insulation in his garage yesterday. Patient limited with exercise secondary to soreness and denied any further stretching or strengthening today that was listed in MPT's POC. Normal response noted to electrical stimulation and moist heat in sitting. Patient reported increased pain with initial steps following sitting.     Rehab Potential   Fair    Clinical Impairments Affecting Rehab Potential  Chronicity and multiple spinal surgeries.    PT Treatment/Interventions  ADLs/Self Care Home Management;Cryotherapy;Electrical Stimulation;Ultrasound;Therapeutic activities;Therapeutic exercise;Functional mobility training;Patient/family education;Manual techniques    PT Next Visit Plan  SKTC; draw-in progression.  Please refer to lumbar fusion protocol.  Modalites PRN.    Consulted and Agree with Plan of Care  Patient       Patient will benefit from skilled therapeutic intervention in order to improve the following deficits and impairments:  Pain, Decreased activity tolerance, Decreased mobility, Abnormal gait, Postural dysfunction, Decreased strength, Decreased range of motion  Visit Diagnosis: Chronic midline low back pain with sciatica, sciatica laterality unspecified  Muscle weakness (generalized)     Problem List Patient Active Problem List   Diagnosis Date Noted  . Lumbar radiculopathy 12/06/2016  . Benign essential HTN 02/23/2016  . Uncomplicated opioid dependence ([place]) 02/23/2016  . Chronic bilateral low back pain 02/23/2016  . Restless leg syndrome 02/23/2016  . Postlaminectomy syndrome, lumbar region 02/23/2016  . Asthma 01/22/2016    Standley Brooking, PTA 02/24/2017, 10:51 AM  [facility] [address] [place], [place], [address] Phone: [phone]   Fax:  [phone]  Name: Jay Ortega MRN: [id number] Date of Birth: 08/19/1965  PHYSICAL THERAPY DISCHARGE SUMMARY  Visits from Start of Care: 2.  Current functional level related to goals / functional outcomes: See above.   Remaining deficits: See below.   Education / Equipment: HEP. Plan: Patient agrees to discharge.  Patient goals were not met. Patient is being discharged due to not returning since the last visit.  ?????         Mali Applegate MPT

## 2017-04-10 ENCOUNTER — Other Ambulatory Visit: Payer: Self-pay | Admitting: Family Medicine

## 2017-05-08 ENCOUNTER — Other Ambulatory Visit: Payer: Self-pay | Admitting: Family Medicine

## 2017-05-08 NOTE — Telephone Encounter (Signed)
OV 07/11/17 

## 2017-05-23 ENCOUNTER — Encounter: Payer: Self-pay | Admitting: Family Medicine

## 2017-05-23 DIAGNOSIS — J4541 Moderate persistent asthma with (acute) exacerbation: Secondary | ICD-10-CM

## 2017-05-31 ENCOUNTER — Encounter: Payer: Self-pay | Admitting: Family Medicine

## 2017-05-31 NOTE — Telephone Encounter (Signed)
Please send scrip as requested. Thanks, WS

## 2017-06-01 ENCOUNTER — Other Ambulatory Visit: Payer: Self-pay

## 2017-06-01 MED ORDER — NEBULIZER/TUBING/MOUTHPIECE KIT: PACK | 11 refills | Status: DC

## 2017-06-01 NOTE — Progress Notes (Unsigned)
Supplies were not sent to correct. Printed and faxed to number that patient provided.

## 2017-06-02 ENCOUNTER — Encounter: Payer: Self-pay | Admitting: Family Medicine

## 2017-06-06 ENCOUNTER — Other Ambulatory Visit: Payer: Self-pay | Admitting: Family Medicine

## 2017-07-05 ENCOUNTER — Other Ambulatory Visit: Payer: Self-pay | Admitting: Family Medicine

## 2017-07-11 ENCOUNTER — Ambulatory Visit: Payer: Medicaid Other | Admitting: Family Medicine

## 2017-07-11 ENCOUNTER — Encounter: Payer: Self-pay | Admitting: Family Medicine

## 2017-07-11 VITALS — BP 120/67 | HR 81 | Ht 66.0 in | Wt 215.5 lb

## 2017-07-11 DIAGNOSIS — L309 Dermatitis, unspecified: Secondary | ICD-10-CM | POA: Insufficient documentation

## 2017-07-11 DIAGNOSIS — I10 Essential (primary) hypertension: Secondary | ICD-10-CM

## 2017-07-11 DIAGNOSIS — N400 Enlarged prostate without lower urinary tract symptoms: Secondary | ICD-10-CM

## 2017-07-11 DIAGNOSIS — Z13220 Encounter for screening for lipoid disorders: Secondary | ICD-10-CM | POA: Diagnosis not present

## 2017-07-11 DIAGNOSIS — F5101 Primary insomnia: Secondary | ICD-10-CM | POA: Diagnosis not present

## 2017-07-11 DIAGNOSIS — G8929 Other chronic pain: Secondary | ICD-10-CM

## 2017-07-11 DIAGNOSIS — J45909 Unspecified asthma, uncomplicated: Secondary | ICD-10-CM

## 2017-07-11 DIAGNOSIS — J4541 Moderate persistent asthma with (acute) exacerbation: Secondary | ICD-10-CM | POA: Diagnosis not present

## 2017-07-11 DIAGNOSIS — M545 Low back pain: Secondary | ICD-10-CM

## 2017-07-11 DIAGNOSIS — L308 Other specified dermatitis: Secondary | ICD-10-CM | POA: Diagnosis not present

## 2017-07-11 MED ORDER — ALBUTEROL SULFATE HFA 108 (90 BASE) MCG/ACT IN AERS: INHALATION_SPRAY | RESPIRATORY_TRACT | 5 refills | Status: DC

## 2017-07-11 MED ORDER — ALBUTEROL SULFATE 1.25 MG/3ML IN NEBU: 1.0000 | INHALATION_SOLUTION | Freq: Four times a day (QID) | RESPIRATORY_TRACT | 11 refills | Status: DC | PRN

## 2017-07-11 MED ORDER — MIRTAZAPINE 30 MG PO TABS: ORAL_TABLET | ORAL | 5 refills | Status: DC

## 2017-07-11 MED ORDER — BUDESONIDE-FORMOTEROL FUMARATE 160-4.5 MCG/ACT IN AERO: 2.0000 | INHALATION_SPRAY | Freq: Two times a day (BID) | RESPIRATORY_TRACT | 3 refills | Status: DC

## 2017-07-11 MED ORDER — CLONIDINE HCL 0.1 MG PO TABS: ORAL_TABLET | ORAL | 5 refills | Status: DC

## 2017-07-11 NOTE — Progress Notes (Signed)
Subjective:  Patient ID: Jay Ortega, male    DOB: 04-27-1965  Age: 52 y.o. MRN: [id number]  CC: Medical Management of Chronic Issues   HPI Tilton Marsalis presents for  follow-up of hypertension. Patient has no history of headache chest pain or shortness of breath or recent cough. Patient also denies symptoms of TIA such as focal numbness or weakness. Patient denies side effects from medication. States taking it regularly.  Patient is also here for follow-up on his asthma and he is having more shortness of breath.  He says that the [employer] does not last like it should.  He is having to use it several times a day and it will run out after 15 or 20 doses.  On the other hand he is using his Symbicort less than once a day.  Currently he does not have an inhaler.  He says his breathing is open today but some days he wheezes quite a bit.  Patient also continues to use his Remeron every day.  It is keeping his mood stable but primarily uses it for sleep and he has been getting good sleep.   History Cayton has a past medical history of Arthritis, Asthma, Foot drop, right, Hypertension, Sciatica of right side, and Sleep apnea.   He has a past surgical history that includes Spine surgery; Rotator cuff repair; Carpal tunnel release (Bilateral); Knee arthroscopy (Left); Cervical fusion; Back surgery; and Multiple tooth extractions.   His family history includes Cancer in his father; Diabetes in his mother.He reports that he has been smoking cigarettes.  He has been smoking about 0.50 packs per day. He has never used smokeless tobacco. He reports that he does not drink alcohol or use drugs.  Current Outpatient Medications on File Prior to Visit  Medication Sig Dispense Refill  . ketoconazole (NIZORAL) 2 % cream Apply 1 application topically 2 (two) times daily. For rash (Patient taking differently: Apply 1 application topically daily as needed (rash). ) 60 g 5  . ketoconazole (NIZORAL) 2 % shampoo APPLY,  SHAMPOO, REMAIN ON SCALP FOR 5 MINUTES, THEN RINSE. USE 3 TIMES A WEEK 120 mL 0  . Multiple Vitamin (MULTIVITAMIN WITH MINERALS) TABS tablet Take 2 tablets by mouth daily.    . Oxycodone HCl 10 MG TABS Take 10 mg by mouth 3 (three) times daily.    Marland Kitchen Respiratory Therapy Supplies (NEBULIZER/TUBING/MOUTHPIECE) KIT Use with nebulizer QID 30 each 11  . tapentadol (NUCYNTA ER) 100 MG 12 hr tablet Take 50 mg by mouth every 12 (twelve) hours.     . vitamin C (ASCORBIC ACID) 500 MG tablet Take 1,000 mg by mouth daily.     No current facility-administered medications on file prior to visit.     ROS Review of Systems  Constitutional: Negative.  Negative for activity change, fatigue and fever.  HENT: Negative.   Eyes: Negative for visual disturbance.  Respiratory: Positive for cough and shortness of breath.   Cardiovascular: Negative for chest pain and leg swelling.  Gastrointestinal: Negative for abdominal pain, diarrhea, nausea and vomiting.  Genitourinary: Negative for difficulty urinating.  Musculoskeletal: Negative for arthralgias and myalgias.  Skin: Positive for rash (scalp rash/ dandruff controlled with meds below however it has been spreading at times to his chest and his face.  They would like to see a dermatologist to see if they can achieve better control.).  Neurological: Negative for headaches.  Psychiatric/Behavioral: Negative for sleep disturbance.    Objective:  BP 120/67   Pulse  81   Ht 5' 6"  (1.676 m)   Wt 215 lb 8 oz (97.8 kg)   BMI 34.78 kg/m   BP Readings from Last 3 Encounters:  07/11/17 120/67  01/10/17 127/83  12/07/16 (!) 154/87    Wt Readings from Last 3 Encounters:  07/11/17 215 lb 8 oz (97.8 kg)  01/10/17 212 lb (96.2 kg)  12/06/16 210 lb (95.3 kg)     Physical Exam  Constitutional: He appears well-developed and well-nourished.  HENT:  Head: Normocephalic and atraumatic.  Right Ear: Tympanic membrane and external ear normal. No decreased hearing is  noted.  Left Ear: Tympanic membrane and external ear normal. No decreased hearing is noted.  Mouth/Throat: No oropharyngeal exudate or posterior oropharyngeal erythema.  Eyes: Pupils are equal, round, and reactive to light.  Neck: Normal range of motion. Neck supple.  Cardiovascular: Normal rate and regular rhythm.  No murmur heard. Pulmonary/Chest: Breath sounds normal. No respiratory distress.  Abdominal: Soft. Bowel sounds are normal. He exhibits no mass. There is no tenderness.  Vitals reviewed.     Assessment & Plan:   Aarish was seen today for medical management of chronic issues.  Diagnoses and all orders for this visit:  Benign essential HTN -     CBC with Differential/Platelet -     CMP14+EGFR -     cloNIDine (CATAPRES) 0.1 MG tablet; TAKE  (1)  TABLET TWICE A DAY FOR HIGH BLOOD PRESSURE.  Chronic bilateral low back pain, with sciatica presence unspecified -     CMP14+EGFR  Moderate persistent asthma with acute exacerbation  Other eczema -     Ambulatory referral to Dermatology  Primary insomnia -     mirtazapine (REMERON) 30 MG tablet; TAKE 1 TABLET AT BEDTIME AS NEEDED FOR SLEEP AND MOOD  Moderate asthma without complication, unspecified whether persistent -     CBC with Differential/Platelet -     CMP14+EGFR -     albuterol (ACCUNEB) 1.25 MG/3ML nebulizer solution; Take 3 mLs (1.25 mg total) by nebulization every 6 (six) hours as needed for wheezing. -     budesonide-formoterol (SYMBICORT) 160-4.5 MCG/ACT inhaler; Inhale 2 puffs into the lungs 2 (two) times daily. For COPD/Asthma  Benign prostatic hyperplasia, unspecified whether lower urinary tract symptoms present -     PSA, total and free  Screening for cholesterol level -     Lipid panel  Other orders -     albuterol (PROAIR HFA) 108 (90 Base) MCG/ACT inhaler; 2 PUFFS EVERY 6 HOURS AS NEEDED FOR WHEEZING OR SHORTNESS OF BREATH   Allergies as of 07/11/2017      Reactions   Codeine Hives, Shortness Of  Breath   Other Other (See Comments)   Tomatoes cause fever blisters.      Medication List        Accurate as of 07/11/17  3:04 PM. Always use your most recent med list.          albuterol 1.25 MG/3ML nebulizer solution Commonly known as:  ACCUNEB Take 3 mLs (1.25 mg total) by nebulization every 6 (six) hours as needed for wheezing.   albuterol 108 (90 Base) MCG/ACT inhaler Commonly known as:  PROAIR HFA 2 PUFFS EVERY 6 HOURS AS NEEDED FOR WHEEZING OR SHORTNESS OF BREATH   budesonide-formoterol 160-4.5 MCG/ACT inhaler Commonly known as:  SYMBICORT Inhale 2 puffs into the lungs 2 (two) times daily. For COPD/Asthma   cloNIDine 0.1 MG tablet Commonly known as:  CATAPRES TAKE  (1)  TABLET  TWICE A DAY FOR HIGH BLOOD PRESSURE.   ketoconazole 2 % cream Commonly known as:  NIZORAL Apply 1 application topically 2 (two) times daily. For rash   ketoconazole 2 % shampoo Commonly known as:  NIZORAL APPLY, SHAMPOO, REMAIN ON SCALP FOR 5 MINUTES, THEN RINSE. USE 3 TIMES A WEEK   mirtazapine 30 MG tablet Commonly known as:  REMERON TAKE 1 TABLET AT BEDTIME AS NEEDED FOR SLEEP AND MOOD   multivitamin with minerals Tabs tablet Take 2 tablets by mouth daily.   Nebulizer/Tubing/Mouthpiece Kit Use with nebulizer QID   NUCYNTA ER 100 MG 12 hr tablet Generic drug:  tapentadol Take 50 mg by mouth every 12 (twelve) hours.   Oxycodone HCl 10 MG Tabs Take 10 mg by mouth 3 (three) times daily.   vitamin C 500 MG tablet Commonly known as:  ASCORBIC ACID Take 1,000 mg by mouth daily.       Meds ordered this encounter  Medications  . albuterol (ACCUNEB) 1.25 MG/3ML nebulizer solution    Sig: Take 3 mLs (1.25 mg total) by nebulization every 6 (six) hours as needed for wheezing.    Dispense:  125 vial    Refill:  11  . albuterol (PROAIR HFA) 108 (90 Base) MCG/ACT inhaler    Sig: 2 PUFFS EVERY 6 HOURS AS NEEDED FOR WHEEZING OR SHORTNESS OF BREATH    Dispense:  8.5 g    Refill:  5    . budesonide-formoterol (SYMBICORT) 160-4.5 MCG/ACT inhaler    Sig: Inhale 2 puffs into the lungs 2 (two) times daily. For COPD/Asthma    Dispense:  3 Inhaler    Refill:  3  . cloNIDine (CATAPRES) 0.1 MG tablet    Sig: TAKE  (1)  TABLET TWICE A DAY FOR HIGH BLOOD PRESSURE.    Dispense:  60 tablet    Refill:  5  . mirtazapine (REMERON) 30 MG tablet    Sig: TAKE 1 TABLET AT BEDTIME AS NEEDED FOR SLEEP AND MOOD    Dispense:  30 tablet    Refill:  5   Continue pain mgmt with Pain Clinic. Follow-up: Return in about 6 months (around 01/11/2018).  Claretta Fraise, M.D.

## 2017-07-12 LAB — LIPID PANEL
Chol/HDL Ratio: 3.3 ratio (ref 0.0–5.0)
Cholesterol, Total: 167 mg/dL (ref 100–199)
HDL: 50 mg/dL (ref >39)
LDL Calculated: 103 mg/dL — ABNORMAL HIGH (ref 0–99)
TRIGLYCERIDES: 68 mg/dL (ref 0–149)
VLDL Cholesterol Cal: 14 mg/dL (ref 5–40)

## 2017-07-12 LAB — CBC WITH DIFFERENTIAL/PLATELET
BASOS ABS: 0 10*3/uL (ref 0.0–0.2)
Basos: 0 %
EOS (ABSOLUTE): 0.4 10*3/uL (ref 0.0–0.4)
Eos: 5 %
Hematocrit: 37.4 % — ABNORMAL LOW (ref 37.5–51.0)
Hemoglobin: 12.5 g/dL — ABNORMAL LOW (ref 13.0–17.7)
IMMATURE GRANS (ABS): 0 10*3/uL (ref 0.0–0.1)
Immature Granulocytes: 0 %
LYMPHS ABS: 2 10*3/uL (ref 0.7–3.1)
LYMPHS: 22 %
MCH: 29.9 pg (ref 26.6–33.0)
MCHC: 33.4 g/dL (ref 31.5–35.7)
MCV: 90 fL (ref 79–97)
MONOCYTES: 12 %
Monocytes Absolute: 1.1 10*3/uL — ABNORMAL HIGH (ref 0.1–0.9)
Neutrophils Absolute: 5.6 10*3/uL (ref 1.4–7.0)
Neutrophils: 61 %
PLATELETS: 225 10*3/uL (ref 150–450)
RBC: 4.18 x10E6/uL (ref 4.14–5.80)
RDW: 14.5 % (ref 12.3–15.4)
WBC: 9.2 10*3/uL (ref 3.4–10.8)

## 2017-07-12 LAB — CMP14+EGFR
A/G RATIO: 1.9 (ref 1.2–2.2)
ALT: 12 IU/L (ref 0–44)
AST: 16 IU/L (ref 0–40)
Albumin: 4.2 g/dL (ref 3.5–5.5)
Alkaline Phosphatase: 73 IU/L (ref 39–117)
BUN/Creatinine Ratio: 9 (ref 9–20)
BUN: 6 mg/dL (ref 6–24)
Bilirubin Total: <0.2 mg/dL (ref 0.0–1.2)
CO2: 27 mmol/L (ref 20–29)
Calcium: 9 mg/dL (ref 8.7–10.2)
Chloride: 102 mmol/L (ref 96–106)
Creatinine, Ser: 0.67 mg/dL — ABNORMAL LOW (ref 0.76–1.27)
GFR calc non Af Amer: 110 mL/min/{1.73_m2} (ref >59)
GFR, EST AFRICAN AMERICAN: 127 mL/min/{1.73_m2} (ref >59)
GLOBULIN, TOTAL: 2.2 g/dL (ref 1.5–4.5)
Glucose: 102 mg/dL — ABNORMAL HIGH (ref 65–99)
POTASSIUM: 3.7 mmol/L (ref 3.5–5.2)
SODIUM: 145 mmol/L — AB (ref 134–144)
Total Protein: 6.4 g/dL (ref 6.0–8.5)

## 2017-07-12 LAB — PSA, TOTAL AND FREE
PROSTATE SPECIFIC AG, SERUM: 0.5 ng/mL (ref 0.0–4.0)
PSA FREE PCT: 36 %
PSA, Free: 0.18 ng/mL

## 2017-08-04 ENCOUNTER — Other Ambulatory Visit: Payer: Self-pay | Admitting: Family Medicine

## 2017-09-04 ENCOUNTER — Other Ambulatory Visit: Payer: Self-pay | Admitting: Family Medicine

## 2017-11-03 ENCOUNTER — Encounter: Payer: Self-pay | Admitting: Family Medicine

## 2017-11-03 ENCOUNTER — Ambulatory Visit: Payer: Medicaid Other | Admitting: Family Medicine

## 2017-11-03 VITALS — BP 151/99 | HR 122 | Temp 97.3°F | Ht 66.0 in | Wt 209.2 lb

## 2017-11-03 DIAGNOSIS — M5416 Radiculopathy, lumbar region: Secondary | ICD-10-CM | POA: Diagnosis not present

## 2017-11-03 DIAGNOSIS — M21371 Foot drop, right foot: Secondary | ICD-10-CM

## 2017-11-03 MED ORDER — TRAMADOL HCL 50 MG PO TABS: 50.0000 mg | ORAL_TABLET | Freq: Four times a day (QID) | ORAL | 0 refills | Status: AC | PRN

## 2017-11-03 MED ORDER — PREDNISONE 10 MG PO TABS: ORAL_TABLET | ORAL | 0 refills | Status: DC

## 2017-11-03 MED ORDER — IPRATROPIUM-ALBUTEROL 20-100 MCG/ACT IN AERS: 2.0000 | INHALATION_SPRAY | Freq: Four times a day (QID) | RESPIRATORY_TRACT | 11 refills | Status: DC | PRN

## 2017-11-03 NOTE — Progress Notes (Signed)
Subjective:  Patient ID: Jay Ortega, male    DOB: 1965/03/07  Age: 52 y.o. MRN: [id number]  CC: Leg Pain (pt here today c/o right leg pain)   HPI Ilir Mahrt presents for worsening pain proximal portion of thigh and posterior leg. 8/10. Not currently using opiates. Has been told to follow up with his neurosurgeon if there is a change.   Depression screen [facility] 2/9 11/03/2017 01/10/2017 09/21/2016  Decreased Interest 1 0 0  Down, Depressed, Hopeless 1 0 0  PHQ - 2 Score 2 0 0  Altered sleeping 2 - -  Tired, decreased energy 1 - -  Change in appetite 0 - -  Feeling bad or failure about yourself  0 - -  Trouble concentrating 0 - -  Moving slowly or fidgety/restless 0 - -  Suicidal thoughts 0 - -  PHQ-9 Score 5 - -    History Ahnaf has a past medical history of Arthritis, Asthma, Foot drop, right, Hypertension, Sciatica of right side, and Sleep apnea.   He has a past surgical history that includes Spine surgery; Rotator cuff repair; Carpal tunnel release (Bilateral); Knee arthroscopy (Left); Cervical fusion; Back surgery; and Multiple tooth extractions.   His family history includes Cancer in his father; Diabetes in his mother.He reports that he has been smoking cigarettes. He has been smoking about 0.50 packs per day. He has never used smokeless tobacco. He reports that he does not drink alcohol or use drugs.    ROS Review of Systems  Constitutional: Negative for fever.  Respiratory: Negative for shortness of breath.   Cardiovascular: Negative for chest pain.  Musculoskeletal: Negative for arthralgias.  Skin: Negative for rash.    Objective:  BP (!) 151/99   Pulse (!) 122   Temp (!) 97.3 F (36.3 C) (Oral)   Ht 5\' 6"  (1.676 m)   Wt 209 lb 4 oz (94.9 kg)   BMI 33.77 kg/m   BP Readings from Last 3 Encounters:  11/03/17 (!) 151/99  07/11/17 120/67  01/10/17 127/83    Wt Readings from Last 3 Encounters:  11/03/17 209 lb 4 oz (94.9 kg)  07/11/17 215 lb 8 oz (97.8  kg)  01/10/17 212 lb (96.2 kg)     Physical Exam  Constitutional: He is oriented to person, place, and time. He appears well-developed and well-nourished. He appears distressed (from pain.).  HENT:  Head: Normocephalic and atraumatic.  Right Ear: External ear normal.  Left Ear: External ear normal.  Mouth/Throat: No oropharyngeal exudate or posterior oropharyngeal erythema.  Eyes: Pupils are equal, round, and reactive to light.  Neck: Normal range of motion. Neck supple.  Cardiovascular: Normal rate and regular rhythm.  No murmur heard. Pulmonary/Chest: Breath sounds normal. No respiratory distress.  Musculoskeletal: He exhibits tenderness (lateral right thigh near trochanteric bursa.).  Neurological: He is alert and oriented to person, place, and time.  Vitals reviewed.     Assessment & Plan:   Lydell was seen today for leg pain.  Diagnoses and all orders for this visit:  Right foot drop -     Ambulatory referral to Neurosurgery  Lumbar radiculopathy -     Ambulatory referral to Neurosurgery  Other orders -     predniSONE (DELTASONE) 10 MG tablet; Take 5 PO x 3 days, then 4 PO x 3 days, then 3 PO x 3 days, then 2 PO x 3 days then 1 PO x 3 days. -     Ipratropium-Albuterol (COMBIVENT) 20-100 MCG/ACT AERS  respimat; Inhale 2 puffs into the lungs every 6 (six) hours as needed for wheezing or shortness of breath. -     traMADol (ULTRAM) 50 MG tablet; Take 1 tablet (50 mg total) by mouth 4 (four) times daily as needed for up to 5 days for moderate pain.       I have discontinued Aurther Lofterry Harju's tapentadol, multivitamin with minerals, vitamin C, Nebulizer/Tubing/Mouthpiece, Oxycodone HCl, mirtazapine, and ketoconazole. I am also having him start on predniSONE, Ipratropium-Albuterol, and traMADol. Additionally, I am having him maintain his albuterol, budesonide-formoterol, cloNIDine, and ciclopirox.  Allergies as of 11/03/2017      Reactions   Codeine Hives, Shortness Of  Breath   Other Other (See Comments)   Tomatoes cause fever blisters.      Medication List        Accurate as of 11/03/17 11:59 PM. Always use your most recent med list.          albuterol 1.25 MG/3ML nebulizer solution Commonly known as:  ACCUNEB Take 3 mLs (1.25 mg total) by nebulization every 6 (six) hours as needed for wheezing.   budesonide-formoterol 160-4.5 MCG/ACT inhaler Commonly known as:  SYMBICORT Inhale 2 puffs into the lungs 2 (two) times daily. For COPD/Asthma   ciclopirox 0.77 % cream Commonly known as:  LOPROX Apply to scalp, face, and trunk 1-2 times a day as needed   cloNIDine 0.1 MG tablet Commonly known as:  CATAPRES TAKE  (1)  TABLET TWICE A DAY FOR HIGH BLOOD PRESSURE.   Ipratropium-Albuterol 20-100 MCG/ACT Aers respimat Commonly known as:  COMBIVENT Inhale 2 puffs into the lungs every 6 (six) hours as needed for wheezing or shortness of breath.   predniSONE 10 MG tablet Commonly known as:  DELTASONE Take 5 PO x 3 days, then 4 PO x 3 days, then 3 PO x 3 days, then 2 PO x 3 days then 1 PO x 3 days.   traMADol 50 MG tablet Commonly known as:  ULTRAM Take 1 tablet (50 mg total) by mouth 4 (four) times daily as needed for up to 5 days for moderate pain.        Follow-up: Return if symptoms worsen or fail to improve.  Mechele ClaudeWarren Kelby Adell, M.D.

## 2017-11-06 ENCOUNTER — Encounter: Payer: Self-pay | Admitting: Family Medicine

## 2017-11-30 ENCOUNTER — Telehealth: Payer: Self-pay | Admitting: Family Medicine

## 2017-11-30 NOTE — Telephone Encounter (Signed)
They have him an appointment with pain clinic today at 2

## 2017-12-28 ENCOUNTER — Other Ambulatory Visit: Payer: Self-pay | Admitting: Family Medicine

## 2017-12-28 DIAGNOSIS — J45909 Unspecified asthma, uncomplicated: Secondary | ICD-10-CM

## 2018-01-01 ENCOUNTER — Telehealth: Payer: Self-pay | Admitting: Family Medicine

## 2018-01-01 NOTE — Telephone Encounter (Signed)
LMOVM will inform provider, have fax to give to provider on day of pt's appt

## 2018-01-10 ENCOUNTER — Ambulatory Visit: Payer: Medicaid Other | Admitting: Family Medicine

## 2018-01-27 ENCOUNTER — Other Ambulatory Visit: Payer: Self-pay | Admitting: Family Medicine

## 2018-01-27 DIAGNOSIS — J45909 Unspecified asthma, uncomplicated: Secondary | ICD-10-CM

## 2018-03-15 ENCOUNTER — Telehealth: Payer: Self-pay | Admitting: Family Medicine

## 2018-04-19 ENCOUNTER — Other Ambulatory Visit: Payer: Self-pay | Admitting: Family Medicine

## 2018-05-21 ENCOUNTER — Other Ambulatory Visit: Payer: Self-pay | Admitting: Family Medicine

## 2018-05-21 NOTE — Telephone Encounter (Signed)
Last seen 11/03/17  Dr Darlyn Read PCP

## 2018-05-24 ENCOUNTER — Other Ambulatory Visit: Payer: Self-pay | Admitting: Family Medicine

## 2018-05-24 DIAGNOSIS — J45909 Unspecified asthma, uncomplicated: Secondary | ICD-10-CM

## 2018-06-20 ENCOUNTER — Telehealth: Payer: Self-pay | Admitting: Family Medicine

## 2018-06-20 NOTE — Telephone Encounter (Signed)
LM pt needs to be scheduled for CPE (HTN)

## 2018-06-25 ENCOUNTER — Other Ambulatory Visit: Payer: Self-pay | Admitting: Family Medicine

## 2018-06-25 ENCOUNTER — Telehealth: Payer: Self-pay | Admitting: Family Medicine

## 2018-06-25 DIAGNOSIS — J45909 Unspecified asthma, uncomplicated: Secondary | ICD-10-CM

## 2018-06-25 NOTE — Telephone Encounter (Signed)
Needs to be seen

## 2018-06-25 NOTE — Telephone Encounter (Signed)
LM to call for appt

## 2018-06-25 NOTE — Telephone Encounter (Signed)
noted 

## 2018-07-25 ENCOUNTER — Other Ambulatory Visit: Payer: Self-pay | Admitting: Family Medicine

## 2018-07-25 NOTE — Telephone Encounter (Signed)
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

## 2018-07-25 NOTE — Telephone Encounter (Signed)
Left message to please call our office to schedule an appointment for refills.

## 2018-10-23 ENCOUNTER — Other Ambulatory Visit: Payer: Self-pay | Admitting: Family Medicine

## 2018-11-26 ENCOUNTER — Other Ambulatory Visit: Payer: Self-pay | Admitting: Family Medicine

## 2018-12-04 ENCOUNTER — Emergency Department (HOSPITAL_COMMUNITY): Payer: Medicaid Other

## 2018-12-04 ENCOUNTER — Inpatient Hospital Stay (HOSPITAL_COMMUNITY)
Admission: EM | Admit: 2018-12-04 | Discharge: 2018-12-05 | DRG: 125 | Discharge status: Against Medical Advice | Payer: Medicaid Other | Attending: Family Medicine | Admitting: Family Medicine

## 2018-12-04 ENCOUNTER — Other Ambulatory Visit: Payer: Self-pay

## 2018-12-04 ENCOUNTER — Encounter (HOSPITAL_COMMUNITY): Payer: Self-pay | Admitting: Emergency Medicine

## 2018-12-04 ENCOUNTER — Inpatient Hospital Stay (HOSPITAL_COMMUNITY): Payer: Medicaid Other

## 2018-12-04 DIAGNOSIS — G4733 Obstructive sleep apnea (adult) (pediatric): Secondary | ICD-10-CM | POA: Diagnosis present

## 2018-12-04 DIAGNOSIS — H538 Other visual disturbances: Secondary | ICD-10-CM | POA: Diagnosis present

## 2018-12-04 DIAGNOSIS — R079 Chest pain, unspecified: Secondary | ICD-10-CM | POA: Diagnosis present

## 2018-12-04 DIAGNOSIS — Z79891 Long term (current) use of opiate analgesic: Secondary | ICD-10-CM | POA: Diagnosis not present

## 2018-12-04 DIAGNOSIS — R519 Headache, unspecified: Secondary | ICD-10-CM | POA: Diagnosis present

## 2018-12-04 DIAGNOSIS — F17210 Nicotine dependence, cigarettes, uncomplicated: Secondary | ICD-10-CM | POA: Diagnosis present

## 2018-12-04 DIAGNOSIS — G8929 Other chronic pain: Secondary | ICD-10-CM | POA: Diagnosis present

## 2018-12-04 DIAGNOSIS — M1990 Unspecified osteoarthritis, unspecified site: Secondary | ICD-10-CM | POA: Diagnosis present

## 2018-12-04 DIAGNOSIS — R29701 NIHSS score 1: Secondary | ICD-10-CM | POA: Diagnosis present

## 2018-12-04 DIAGNOSIS — Z20828 Contact with and (suspected) exposure to other viral communicable diseases: Secondary | ICD-10-CM | POA: Diagnosis present

## 2018-12-04 DIAGNOSIS — M961 Postlaminectomy syndrome, not elsewhere classified: Secondary | ICD-10-CM | POA: Diagnosis present

## 2018-12-04 DIAGNOSIS — Z981 Arthrodesis status: Secondary | ICD-10-CM

## 2018-12-04 DIAGNOSIS — Z5329 Procedure and treatment not carried out because of patient's decision for other reasons: Secondary | ICD-10-CM | POA: Diagnosis not present

## 2018-12-04 DIAGNOSIS — R000 Tachycardia, unspecified: Secondary | ICD-10-CM | POA: Diagnosis present

## 2018-12-04 DIAGNOSIS — I160 Hypertensive urgency: Secondary | ICD-10-CM | POA: Diagnosis present

## 2018-12-04 DIAGNOSIS — J45909 Unspecified asthma, uncomplicated: Secondary | ICD-10-CM | POA: Diagnosis present

## 2018-12-04 DIAGNOSIS — Z9981 Dependence on supplemental oxygen: Secondary | ICD-10-CM | POA: Diagnosis not present

## 2018-12-04 DIAGNOSIS — M19049 Primary osteoarthritis, unspecified hand: Secondary | ICD-10-CM | POA: Diagnosis present

## 2018-12-04 DIAGNOSIS — R29810 Facial weakness: Secondary | ICD-10-CM | POA: Diagnosis present

## 2018-12-04 DIAGNOSIS — G2581 Restless legs syndrome: Secondary | ICD-10-CM | POA: Diagnosis present

## 2018-12-04 DIAGNOSIS — M21371 Foot drop, right foot: Secondary | ICD-10-CM | POA: Diagnosis present

## 2018-12-04 DIAGNOSIS — I10 Essential (primary) hypertension: Secondary | ICD-10-CM | POA: Diagnosis present

## 2018-12-04 DIAGNOSIS — Z79899 Other long term (current) drug therapy: Secondary | ICD-10-CM | POA: Diagnosis not present

## 2018-12-04 DIAGNOSIS — Z885 Allergy status to narcotic agent status: Secondary | ICD-10-CM

## 2018-12-04 DIAGNOSIS — Z7951 Long term (current) use of inhaled steroids: Secondary | ICD-10-CM | POA: Diagnosis not present

## 2018-12-04 LAB — DIFFERENTIAL
Abs Immature Granulocytes: 0.05 10*3/uL (ref 0.00–0.07)
Basophils Absolute: 0.1 10*3/uL (ref 0.0–0.1)
Basophils Relative: 1 %
Eosinophils Absolute: 0.4 10*3/uL (ref 0.0–0.5)
Eosinophils Relative: 3 %
Immature Granulocytes: 0 %
Lymphocytes Relative: 22 %
Lymphs Abs: 2.6 10*3/uL (ref 0.7–4.0)
Monocytes Absolute: 1.2 10*3/uL — ABNORMAL HIGH (ref 0.1–1.0)
Monocytes Relative: 10 %
Neutro Abs: 7.8 10*3/uL — ABNORMAL HIGH (ref 1.7–7.7)
Neutrophils Relative %: 64 %

## 2018-12-04 LAB — COMPREHENSIVE METABOLIC PANEL
ALT: 20 U/L (ref 0–44)
AST: 21 U/L (ref 15–41)
Albumin: 4.1 g/dL (ref 3.5–5.0)
Alkaline Phosphatase: 69 U/L (ref 38–126)
Anion gap: 8 (ref 5–15)
BUN: 18 mg/dL (ref 6–20)
CO2: 26 mmol/L (ref 22–32)
Calcium: 9 mg/dL (ref 8.9–10.3)
Chloride: 103 mmol/L (ref 98–111)
Creatinine, Ser: 0.72 mg/dL (ref 0.61–1.24)
GFR calc Af Amer: >60 mL/min (ref >60)
GFR calc non Af Amer: >60 mL/min (ref >60)
Glucose, Bld: 103 mg/dL — ABNORMAL HIGH (ref 70–99)
Potassium: 4.2 mmol/L (ref 3.5–5.1)
Sodium: 137 mmol/L (ref 135–145)
Total Bilirubin: 0.3 mg/dL (ref 0.3–1.2)
Total Protein: 7.6 g/dL (ref 6.5–8.1)

## 2018-12-04 LAB — CBC
HCT: 47.5 % (ref 39.0–52.0)
Hemoglobin: 15.7 g/dL (ref 13.0–17.0)
MCH: 31.5 pg (ref 26.0–34.0)
MCHC: 33.1 g/dL (ref 30.0–36.0)
MCV: 95.2 fL (ref 80.0–100.0)
Platelets: 306 10*3/uL (ref 150–400)
RBC: 4.99 MIL/uL (ref 4.22–5.81)
RDW: 12.4 % (ref 11.5–15.5)
WBC: 12 10*3/uL — ABNORMAL HIGH (ref 4.0–10.5)
nRBC: 0 % (ref 0.0–0.2)

## 2018-12-04 LAB — APTT: aPTT: 35 seconds (ref 24–36)

## 2018-12-04 LAB — PROTIME-INR
INR: 0.9 (ref 0.8–1.2)
Prothrombin Time: 11.8 seconds (ref 11.4–15.2)

## 2018-12-04 LAB — TROPONIN I (HIGH SENSITIVITY)
Troponin I (High Sensitivity): 3 ng/L (ref <18)
Troponin I (High Sensitivity): 3 ng/L (ref <18)

## 2018-12-04 LAB — ETHANOL: Alcohol, Ethyl (B): <10 mg/dL (ref <10)

## 2018-12-04 MED ORDER — FENTANYL CITRATE (PF) 100 MCG/2ML IJ SOLN
100.0000 ug | Freq: Once | INTRAMUSCULAR | Status: AC
  Administered 2018-12-04: 100 ug via INTRAVENOUS
  Filled 2018-12-04: qty 2

## 2018-12-04 MED ORDER — LIDOCAINE HCL (PF) 1 % IJ SOLN
INTRAMUSCULAR | Status: AC
  Filled 2018-12-04: qty 30

## 2018-12-04 MED ORDER — SODIUM CHLORIDE 0.9 % IV BOLUS
1000.0000 mL | Freq: Once | INTRAVENOUS | Status: AC
  Administered 2018-12-04: 22:00:00 1000 mL via INTRAVENOUS

## 2018-12-04 MED ORDER — IOHEXOL 350 MG/ML SOLN
100.0000 mL | Freq: Once | INTRAVENOUS | Status: AC | PRN
  Administered 2018-12-04: 21:00:00 100 mL via INTRAVENOUS

## 2018-12-04 MED ORDER — HYDROMORPHONE HCL 1 MG/ML IJ SOLN
1.0000 mg | Freq: Once | INTRAMUSCULAR | Status: AC
  Administered 2018-12-04: 22:00:00 1 mg via INTRAVENOUS
  Filled 2018-12-04: qty 1

## 2018-12-04 MED ORDER — HYDROMORPHONE HCL 1 MG/ML IJ SOLN
1.0000 mg | Freq: Once | INTRAMUSCULAR | Status: AC
  Administered 2018-12-04: 23:00:00 1 mg via INTRAVENOUS
  Filled 2018-12-04: qty 1

## 2018-12-04 MED ORDER — LIDOCAINE HCL (PF) 1 % IJ SOLN
30.0000 mL | Freq: Once | INTRAMUSCULAR | Status: AC
  Administered 2018-12-04: 30 mL

## 2018-12-04 MED ORDER — SODIUM CHLORIDE 0.9 % IV BOLUS
1000.0000 mL | Freq: Once | INTRAVENOUS | Status: AC
  Administered 2018-12-04: 23:00:00 1000 mL via INTRAVENOUS

## 2018-12-04 MED ORDER — IOHEXOL 350 MG/ML SOLN
75.0000 mL | Freq: Once | INTRAVENOUS | Status: AC | PRN
  Administered 2018-12-04: 75 mL via INTRAVENOUS

## 2018-12-04 NOTE — H&P (Signed)
TRH H&P    Patient Demographics:    Jay Ortega, is a 53 y.o. male  MRN: [id number]  DOB - 1965-12-08  Admit Date - 12/04/2018  Referring MD/NP/PA: Dr. Kathrynn Humble  Outpatient Primary MD for the patient is Patient, No Pcp Per  Patient coming from: Home  Chief complaint-headache left-sided numbness   HPI:    Jay Ortega  is a 53 y.o. male, with history of hypertension, chronic back pain, asthma, sleep apnea came to hospital with complaints of headache and left facial numbness, left-sided blurred vision chest pain with radiation of the pain into the left upper extremity.  Patient says that he has had headache for past 1 week.  But symptoms got worse today.  He developed numbness of the left side of the face.  Patient says that this headache usually last for several hours.  He has tried at home oxycodone and went to his PCP who prescribed Imitrex which did not provide any relief of symptoms.  He admits to some photophobia.  Denies nausea or vomiting with the headaches.  Denies abdominal pain.  Patient does wear 2 L of oxygen at home at bedtime for sleep apnea.  At this time patient's most of symptoms have resolved.  He does not have facial droop.  Still continues to have headache.  CT head was negative for acute stroke.  In the ED code tele neurology was consulted, neurologist recommended CTA head and neck, chest, abdomen to rule out dissection/aneurysm.  CT chest abdomen was negative for aortic dissection.  Patient also underwent LP by ED provider to rule out meningitis.  He denies previous history of stroke or seizures. No history of CAD Denies abdominal pain or dysuria    Review of systems:    In addition to the HPI above,    All other systems reviewed and are negative.    Past History of the following :    Past Medical History:  Diagnosis Date   Arthritis    back, hand   Asthma    Foot drop,  right    Hypertension    Sciatica of right side    Sleep apnea    negative in 2018 but has a pulmonary appointment in the near furture      Past Surgical History:  Procedure Laterality Date   BACK SURGERY     about 10   CARPAL TUNNEL RELEASE Bilateral    CERVICAL FUSION     KNEE ARTHROSCOPY Left    two surgeries   MULTIPLE TOOTH EXTRACTIONS     ROTATOR CUFF REPAIR     SPINE SURGERY     13 surgeries      Social History:      Social History   Tobacco Use   Smoking status: Current Every Day Smoker    Packs/day: 0.50    Types: Cigarettes   Smokeless tobacco: Never Used  Substance Use Topics   Alcohol use: No       Family History :     Family History  Problem Relation Age  of Onset   Diabetes Mother    Cancer Father        prostate      Home Medications:   Prior to Admission medications   Medication Sig Start Date End Date Taking? Authorizing Provider  albuterol (PROAIR HFA) 108 (90 Base) MCG/ACT inhaler 2 PUFFS EVERY 6 HOURS AS NEEDED FOR WHEEZING OR SHORTNESS OF BREATH Patient taking differently: Inhale 2 puffs into the lungs every 6 (six) hours as needed for wheezing or shortness of breath.  05/21/18  Yes Rakes, Doralee Albino, FNP  cloNIDine (CATAPRES - DOSED IN MG/24 HR) 0.2 mg/24hr patch Place 0.2 mg onto the skin once a week.   Yes [provider]  Ipratropium-Albuterol (COMBIVENT RESPIMAT) 20-100 MCG/ACT AERS respimat Inhale 2 puffs into the lungs every 6 (six) hours as needed for wheezing. (Needs to be seen before next refill) Patient taking differently: Inhale 2 puffs into the lungs every 6 (six) hours as needed for wheezing.  10/24/18  Yes Mechele Claude, MD  oxyCODONE (OXYCONTIN) 10 mg 12 hr tablet Take 10 mg by mouth 3 (three) times daily.   Yes [provider]  SYMBICORT 160-4.5 MCG/ACT inhaler 2 PUFFS 2 TIMES A DAY FOR COPD/ASTHMA Patient taking differently: Inhale 2 puffs into the lungs 2 (two) times daily.  05/24/18  Yes  Mechele Claude, MD  tapentadol (NUCYNTA) 50 MG tablet Take 50 mg by mouth 2 (two) times daily.   Yes [provider]     Allergies:     Allergies  Allergen Reactions   Codeine Hives and Shortness Of Breath   Other Other (See Comments)    Tomatoes cause fever blisters.     Physical Exam:   Vitals  Blood pressure (!) 137/111, pulse 91, temperature 97.8 F (36.6 C), resp. rate 20, height  (1.676 m), weight 90.7 kg, SpO2 94 %.  1.  General: Appears in no acute distress  2. Psychiatric: Alert, oriented x3, intact insight and judgment  3. Neurologic: Cranial nerves II through XII grossly intact, motor strength 5/5 in all extremities, sensation to light touch mildly reduced on left side of the face  4. HEENMT:  Atraumatic normocephalic, extraocular muscles are intact  5. Respiratory : Bilateral wheezing auscultated  6. Cardiovascular : S1-S2, regular, no murmur auscultated  7. Gastrointestinal:  Abdomen is soft, nontender, no organomegaly    Data Review:    CBC Recent Labs  Lab 12/04/18 2000  WBC 12.0*  HGB 15.7  HCT 47.5  PLT 306  MCV 95.2  MCH 31.5  MCHC 33.1  RDW 12.4  LYMPHSABS 2.6  MONOABS 1.2*  EOSABS 0.4  BASOSABS 0.1   ------------------------------------------------------------------------------------------------------------------  Results for orders placed or performed during the hospital encounter of 12/04/18 (from the past 48 hour(s))  Ethanol     Status: None   Collection Time: 12/04/18  8:00 PM  Result Value Ref Range   Alcohol, Ethyl (B) <10 <10 mg/dL    Comment: (NOTE) Lowest detectable limit for serum alcohol is 10 mg/dL. For medical purposes only. Performed at [facility], [address], [place], [place] [address]   Protime-INR     Status: None   Collection Time: 12/04/18  8:00 PM  Result Value Ref Range   Prothrombin Time 11.8 11.4 - 15.2 seconds   INR 0.9 0.8 - 1.2    Comment: (NOTE) INR goal varies based on  device and disease states. Performed at [facility], [address], [place], [place] [address]   APTT  Status: None   Collection Time: 12/04/18  8:00 PM  Result Value Ref Range   aPTT 35 24 - 36 seconds    Comment: Performed at [facility], [address], [place], [place] [address]  CBC     Status: Abnormal   Collection Time: 12/04/18  8:00 PM  Result Value Ref Range   WBC 12.0 (H) 4.0 - 10.5 K/uL   RBC 4.99 4.22 - 5.81 MIL/uL   Hemoglobin 15.7 13.0 - 17.0 g/dL   HCT 56.4 33.2 - 95.1 %   MCV 95.2 80.0 - 100.0 fL   MCH 31.5 26.0 - 34.0 pg   MCHC 33.1 30.0 - 36.0 g/dL   RDW 88.4 16.6 - 06.3 %   Platelets 306 150 - 400 K/uL   nRBC 0.0 0.0 - 0.2 %    Comment: Performed at [facility], [address] [place], [place] [address]  Differential     Status: Abnormal   Collection Time: 12/04/18  8:00 PM  Result Value Ref Range   Neutrophils Relative % 64 %   Neutro Abs 7.8 (H) 1.7 - 7.7 K/uL   Lymphocytes Relative 22 %   Lymphs Abs 2.6 0.7 - 4.0 K/uL   Monocytes Relative 10 %   Monocytes Absolute 1.2 (H) 0.1 - 1.0 K/uL   Eosinophils Relative 3 %   Eosinophils Absolute 0.4 0.0 - 0.5 K/uL   Basophils Relative 1 %   Basophils Absolute 0.1 0.0 - 0.1 K/uL   Immature Granulocytes 0 %   Abs Immature Granulocytes 0.05 0.00 - 0.07 K/uL    Comment: Performed at [facility], [address], [place], [place] [address]  Comprehensive metabolic panel     Status: Abnormal   Collection Time: 12/04/18  8:00 PM  Result Value Ref Range   Sodium 137 135 - 145 mmol/L   Potassium 4.2 3.5 - 5.1 mmol/L   Chloride 103 98 - 111 mmol/L   CO2 26 22 - 32 mmol/L   Glucose, Bld 103 (H) 70 - 99 mg/dL   BUN 18 6 - 20 mg/dL   Creatinine, Ser 5.57 0.61 - 1.24 mg/dL   Calcium 9.0 8.9 - 32.2 mg/dL   Total Protein 7.6 6.5 - 8.1 g/dL   Albumin 4.1 3.5 - 5.0 g/dL   AST 21 15 - 41 U/L   ALT 20 0 - 44 U/L   Alkaline Phosphatase 69 38 - 126 U/L   Total Bilirubin 0.3 0.3 - 1.2 mg/dL   GFR calc non Af Amer >60  >60 mL/min   GFR calc Af Amer >60 >60 mL/min   Anion gap 8 5 - 15    Comment: Performed at [facility], [address] [place], [place] [address]  Troponin I (High Sensitivity)     Status: None   Collection Time: 12/04/18  8:00 PM  Result Value Ref Range   Troponin I (High Sensitivity) 3 <18 ng/L    Comment: (NOTE) Elevated high sensitivity troponin I (hsTnI) values and significant  changes across serial measurements may suggest ACS but many other  chronic and acute conditions are known to elevate hsTnI results.  Refer to the "Links" section for chest pain algorithms and additional  guidance. Performed at [facility], [address], [place], [place] [address]   Troponin I (High Sensitivity)     Status: None   Collection Time: 12/04/18  9:59 PM  Result Value Ref Range   Troponin I (High Sensitivity) 3 <18 ng/L  Comment: (NOTE) Elevated high sensitivity troponin I (hsTnI) values and significant  changes across serial measurements may suggest ACS but many other  chronic and acute conditions are known to elevate hsTnI results.  Refer to the "Links" section for chest pain algorithms and additional  guidance. Performed at [facility], [address] [place], [place] [address]     Chemistries  Recent Labs  Lab 12/04/18 2000  NA 137  K 4.2  CL 103  CO2 26  GLUCOSE 103*  BUN 18  CREATININE 0.72  CALCIUM 9.0  AST 21  ALT 20  ALKPHOS 69  BILITOT 0.3   ------------------------------------------------------------------------------------------------------------------  ------------------------------------------------------------------------------------------------------------------ GFR: Estimated Creatinine Clearance: 110.1 mL/min (by C-G formula based on SCr of 0.72 mg/dL). Liver Function Tests: Recent Labs  Lab 12/04/18 2000  AST 21  ALT 20  ALKPHOS 69  BILITOT 0.3  PROT 7.6  ALBUMIN 4.1   No results for input(s): LIPASE, AMYLASE in the last 168 hours. No  results for input(s): AMMONIA in the last 168 hours. Coagulation Profile: Recent Labs  Lab 12/04/18 2000  INR 0.9   Cardiac Enzymes: No results for input(s): CKTOTAL, CKMB, CKMBINDEX, TROPONINI in the last 168 hours. BNP (last 3 results) No results for input(s): PROBNP in the last 8760 hours. HbA1C: No results for input(s): HGBA1C in the last 72 hours. CBG: No results for input(s): GLUCAP in the last 168 hours. Lipid Profile: No results for input(s): CHOL, HDL, LDLCALC, TRIG, CHOLHDL, LDLDIRECT in the last 72 hours. Thyroid Function Tests: No results for input(s): TSH, T4TOTAL, FREET4, T3FREE, THYROIDAB in the last 72 hours. Anemia Panel: No results for input(s): VITAMINB12, FOLATE, FERRITIN, TIBC, IRON, RETICCTPCT in the last 72 hours.  --------------------------------------------------------------------------------------------------------------- Urine analysis:    Component Value Date/Time   APPEARANCEUR Clear 01/20/2016 1501   GLUCOSEU Negative 01/20/2016 1501   BILIRUBINUR Negative 01/20/2016 1501   PROTEINUR Negative 01/20/2016 1501   NITRITE Negative 01/20/2016 1501   LEUKOCYTESUR Negative 01/20/2016 1501      Imaging Results:    Ct Angio Chest/abd/pel For Dissection W And/or Wo Contrast  Result Date: 12/04/2018 CLINICAL DATA:  Chest pain EXAM: CT ANGIOGRAPHY CHEST, ABDOMEN AND PELVIS TECHNIQUE: Multidetector CT imaging through the chest, abdomen and pelvis was performed using the standard protocol during bolus administration of intravenous contrast. Multiplanar reconstructed images and MIPs were obtained and reviewed to evaluate the vascular anatomy. CONTRAST:  OMNIPAQUE IOHEXOL 350 MG/ML SOLN COMPARISON:  Chest x-ray 10/07/2016 FINDINGS: CTA CHEST FINDINGS Cardiovascular: Non contrasted images of the chest demonstrate no acute intramural hematoma. Negative for acute aortic dissection or aneurysm. Normal heart size. No pericardial effusion Mediastinum/Nodes: No  enlarged mediastinal, hilar, or axillary lymph nodes. Thyroid gland, trachea, and esophagus demonstrate no significant findings. Lungs/Pleura: Moderate emphysema. No acute consolidation or effusion. 3 mm left upper lobe ground-glass nodule, series 7, image number 80. Musculoskeletal: No acute or suspicious osseous abnormality. Review of the MIP images confirms the above findings. CTA ABDOMEN AND PELVIS FINDINGS VASCULAR Aorta: Normal caliber aorta without aneurysm, dissection, vasculitis or significant stenosis. Mild aortic atherosclerosis. Celiac: Patent without evidence of aneurysm, dissection, vasculitis or significant stenosis. Incomplete opacification of portions of splenic artery. SMA: Patent without evidence of aneurysm, dissection, vasculitis or significant stenosis. Renals: Single right and single left renal arteries are without significant stenosis. IMA: Patent without evidence of aneurysm, dissection, vasculitis or significant stenosis. Inflow: Patent without evidence of aneurysm, dissection, vasculitis or significant stenosis. Review of the MIP images confirms the above findings. NON-VASCULAR Hepatobiliary:  No focal liver abnormality is seen. No gallstones, gallbladder wall thickening, or biliary dilatation. Pancreas: Unremarkable. No pancreatic ductal dilatation or surrounding inflammatory changes. Spleen: Normal in size without focal abnormality. Adrenals/Urinary Tract: Adrenal glands are unremarkable. Kidneys are normal, without renal calculi, focal lesion, or hydronephrosis. Bladder is unremarkable. Stomach/Bowel: Stomach is within normal limits. Appendix appears normal. No evidence of bowel wall thickening, distention, or inflammatory changes. Lymphatic: No significantly enlarged lymph nodes Reproductive: Prostate is unremarkable. Other: Negative for free air or free fluid Musculoskeletal: Post fusion changes at L4 through S1. No acute or suspicious osseous abnormality Review of the MIP images  confirms the above findings. IMPRESSION: 1. Negative for acute aortic dissection for aneurysm. 2. Emphysema 3. 3 mm left upper lobe ground-glass nodule. No follow-up recommended. This recommendation follows the consensus statement: Guidelines for Management of Incidental Pulmonary Nodules Detected on CT Images: From the Fleischner Society 2017; Radiology 2017; 284:228-243. 4. No CT evidence for acute intra-abdominal or pelvic abnormality. Electronically Signed   By: Jasmine Pang M.D.   On: 12/04/2018 20:51   Ct Head Code Stroke Wo Contrast`  Result Date: 12/04/2018 CLINICAL DATA:  Code stroke.  Severe headache over the last 3 days. EXAM: CT HEAD WITHOUT CONTRAST TECHNIQUE: Contiguous axial images were obtained from the base of the skull through the vertex without intravenous contrast. COMPARISON:  None. FINDINGS: Brain: Normal appearance without evidence of atrophy, old or acute infarction, mass lesion, hemorrhage, hydrocephalus or extra-axial collection. Vascular: No abnormal vascular finding. Skull: Normal Sinuses/Orbits: Clear/normal Other: None ASPECTS (Alberta Stroke Program Early CT Score) - Ganglionic level infarction (caudate, lentiform nuclei, internal capsule, insula, M1-M3 cortex): 7 - Supraganglionic infarction (M4-M6 cortex): 3 Total score (0-10 with 10 being normal): 10 IMPRESSION: 1. Normal head CT 2. ASPECTS is 10 3. These results were called by telephone at the time of interpretation on 12/04/2018 at 8:27 pm to provider [facility] , who verbally acknowledged these results. Electronically Signed   By: Paulina Fusi M.D.   On: 12/04/2018 20:31    My personal review of EKG: Rhythm NSR, no ST changes.   Assessment & Plan:    Active Problems:   Headache   1. Headache/left facial numbness-CT head was negative for acute stroke, neurology recommended stroke work-up.  Will admit patient, initiate stroke work-up.  CTA head and neck has been ordered, will order MRI brain for tomorrow  morning.  Echocardiogram, hemoglobin A1c, lipid profile.  Will give aspirin 325 mg p.o. daily, swallow screen.  Patient also underwent LP, follow CSF analysis to rule out meningitis.  The very low likelihood of meningitis.  Will consult in-house neurology in a.m.  2. Chest pain-resolved, patient presented with chest pain with left arm numbness.  Also has hypertensive urgency.  Troponin x2 is negative.  EKG is unremarkable.  3. Hypertension-blood pressure mildly elevated, continue transdermal Catapres 0.2 mg weekly.  4. Chronic back pain/headache as above-continue Dilaudid 1 mg every 4 hours as needed  5. Asthma/sleep apnea-patient uses 2 L of oxygen at nighttime for sleep apnea.  Continue Dulera.  Also start DuoNebs every 6 hours.     DVT Prophylaxis-SCDs  AM Labs Ordered, also please review Full Orders  Family Communication: Admission, patients condition and plan of care including tests being ordered have been discussed with the patient  who indicate understanding and agree with the plan and Code Status.  Code Status: Full code  Admission status: Inpatient: Based on patients clinical presentation and evaluation of above clinical data, I  have made determination that patient meets Inpatient criteria at this time.  Time spent in minutes : 60 minutes   Meredeth IdeGagan S Nestor Wieneke M.D on 12/04/2018 at 11:49 PM

## 2018-12-04 NOTE — Consult Note (Addendum)
No new or acute large vessel occlusion is reported on head and neck CTA, not a candidate for mechanical thrombectomy.   ///   [facility] Tele[facility]urology Consult Services   Date of Service:   12/04/2018 20:08:37  Impression:     .  Rule Out Acute Ischemic Stroke     .  vs meningitis  Comments/Sign-Out: Patient with history of hypertension, restless leg syndrome, back pain Presents with severe headache, chest pain, blurry vision, right facial droop. Head CT: No acute Intracranial hemorrhage. NIHSS 1 Symptoms/presentation is concerning for aneurysm/dissection or CNS infection, unlikely to be a stroke. IV Alteplase not recommended. Will check STAT CTA head and neck for dissection/aneurysm. STAT LP is recommended.  Metrics: Last Known Well: 12/04/2018 17:00:00 [facility] Notification Time: 12/04/2018 08:67:61 Arrival Time: 12/04/2018 19:58:00 Stamp Time: 12/04/2018 20:08:37 Time First Login Attempt: 12/04/2018 20:16:14 Video Start Time: 12/04/2018 20:16:14  Symptoms: headache, right facial droop NIHSS Start Assessment Time: 12/04/2018 20:29:00 Patient is not a candidate for Alteplase/Activase. Patient was not deemed candidate for Alteplase/Activase thrombolytics because of Exam is nonfocal (no gaze deviation, no obvious hemianopia, face is symmetric, moving all four extremities equally, no obvious loss of sensation), suspicion for Acute Ischemic Stroke is low.. Video End Time: 12/04/2018 20:37:17  CT head was reviewed.  Clinical Presentation is not Suggestive of Large Vessel Occlusive Disease  ED nursing notified of diagnostic impression and management plan on 12/04/2018 20:45:44 Provider is with a critical patient.  Our recommendations are outlined below.  Recommendations:     .  Activate Stroke Protocol Admission/Order Set     .  Stroke/Telemetry Floor     .  Neuro Checks     .  Bedside Swallow Eval     .  DVT Prophylaxis     .  IV Fluids, Normal  Saline     .  Head of Bed 30 Degrees     .  Euglycemia and Avoid Hyperthermia (PRN Acetaminophen)     .  Antiplatelet Therapy Recommended     .  will order STAT head and neck CTA to rule out aneurysm and dissection.     Marland Kitchen  STAT LP in ER, Please check CSF for:     .  Cell count with diff, protein, glucose, gram stain, culture, HSV PCR.     .  save sample for additional testing.     .  routine Noncontrast brain MRI     .  ECHO  Routine Consultation with Inhouse Neurology for Follow up Care    ------------------------------------------------------------------------------  History of Present Illness: Patient is a 53 year old Male.  Patient was brought by private transportation with symptoms of headache, right facial droop  Patient with history of hypertension, restless leg syndrome, back pain last known well per patient and Presenting symptoms/Chief complaint/reason for consult: 17:00 presents with left sided blurred vision, severe headache, right facial droop. Has been having a headache for about a week off and on. (no history of headache) Headache got worse around 17:00 today. No head trauma, no fever chills Anticoagulation or Antiplatelet use: no Premorbid Level of function: Independent, Normal cognition, Speech and gait Chart reviewed for Pertinent medical, surgical, family history. Chart reviewed for Medications list and allergies. Pertinent positive and negative review of systems noted in History of Present Illness.   Past Medical History:     . Hypertension  Anticoagulant use:  No  Antiplatelet use: No  Examination: BP(132/93), Pulse(116), Blood Glucose(pending) 1A: Level of Consciousness - Alert;  keenly responsive + 0 1B: Ask Month and Age - Both Questions Right + 0 1C: Blink Eyes & Squeeze Hands - Performs Both Tasks + 0 2: Test Horizontal Extraocular Movements - Normal + 0 3: Test Visual Fields - No Visual Loss + 0 4: Test Facial Palsy (Use Grimace if Obtunded) - Minor  paralysis (flat nasolabial fold, smile asymmetry) + 1 5A: Test Left Arm Motor Drift - No Drift for 10 Seconds + 0 5B: Test Right Arm Motor Drift - No Drift for 10 Seconds + 0 6A: Test Left Leg Motor Drift - No Drift for 5 Seconds + 0 6B: Test Right Leg Motor Drift - No Drift for 5 Seconds + 0 7: Test Limb Ataxia (FNF/Heel-Shin) - No Ataxia + 0 8: Test Sensation - Normal; No sensory loss + 0 9: Test Language/Aphasia - Normal; No aphasia + 0 10: Test Dysarthria - Normal + 0 11: Test Extinction/Inattention - No abnormality + 0  NIHSS Score: 1  Patient/Family was informed the Neurology Consult would happen via TeleHealth consult by way of interactive audio and video telecommunications and consented to receiving care in this manner.   Due to the immediate potential for life-threatening deterioration due to underlying acute neurologic illness, I spent 21 minutes providing critical care. This time includes time for face to face visit via telemedicine, review of medical records, imaging studies and discussion of findings with providers, the patient and/or family.   Dr Elenor Quinones   [facility] [phone]   Case [id number]

## 2018-12-04 NOTE — ED Provider Notes (Signed)
[facility]GENCY DEPARTMENT Provider Note   CSN: [id number] Arrival date & time: 12/04/18  1947     History   Chief Complaint Chief Complaint  Patient presents with  . Headache  . Chest Pain    HPI Jay Ortega is a 53 y.o. male with history of arthritis, chronic right foot drop, hypertension, OSA presents for evaluation of acute onset, constant left-sided headache and chest pains beginning at 5 PM today.  He reports for the last week he has had intermittent headaches but while sitting at rest at 5 PM today he developed sudden onset severe left-sided headache, blurred vision on the left side, left-sided chest pains with radiation of pain into the left upper extremity.  He was not having a headache prior to 5 PM today.  Endorses paresthesias to the left side of the body.  He notes shortness of breath and has had nausea with a few episodes of nonbloody nonbilious emesis.  Denies abdominal pain.  He does tell me that he has been having these headaches intermittently with associated vision loss and numbness to the left side of the face for 1 week.  They will come on randomly, typically last for several hours.  He has tried his home oxycodone and went to his PCP who prescribed Imitrex which he has taken without relief of symptoms.  No aggravating or alleviating factors noted.  He is a current smoker of approximately a pack of cigarettes daily, denies recreational drug use.  He is prescribed oxycodone chronically.  No fever.  He reports he has had "a dozen" spine surgeries.      The history is provided by the patient and a relative. The history is limited by the condition of the patient.    Past Medical History:  Diagnosis Date  . Arthritis    back, hand  . Asthma   . Foot drop, right   . Hypertension   . Sciatica of right side   . Sleep apnea    negative in 2018 but has a pulmonary appointment in the near furture    Patient Active Problem List   Diagnosis Date Noted  .  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 Eczema 07/11/2017  . Lumbar radiculopathy 12/06/2016  . Benign essential HTN 02/23/2016  . Uncomplicated opioid dependence (HCC) 02/23/2016  . Chronic bilateral low back pain 02/23/2016  . Restless leg syndrome 02/23/2016  . Postlaminectomy syndrome, lumbar region 02/23/2016  . Asthma 01/22/2016    Past Surgical History:  Procedure Laterality Date  . BACK SURGERY     about 10  . CARPAL TUNNEL RELEASE Bilateral   . CERVICAL FUSION    . KNEE ARTHROSCOPY Left    two surgeries  . MULTIPLE TOOTH EXTRACTIONS    . ROTATOR CUFF REPAIR    . SPINE SURGERY     13 surgeries        Home Medications    Prior to Admission medications   Medication Sig Start Date End Date Taking? Authorizing Provider  albuterol (PROAIR HFA) 108 (90 Base) MCG/ACT inhaler 2 PUFFS EVERY 6 HOURS AS NEEDED FOR WHEEZING OR SHORTNESS OF BREATH Patient taking differently: Inhale 2 puffs into the lungs every 6 (six) hours as needed for wheezing or shortness of breath.  05/21/18  Yes Rakes, Doralee AlbinoLinda M, FNP  cloNIDine (CATAPRES - DOSED IN MG/24 HR) 0.2 mg/24hr patch Place 0.2 mg onto the skin once a week.   Yes [provider]  Ipratropium-Albuterol (COMBIVENT RESPIMAT) 20-100 MCG/ACT AERS respimat Inhale  2 puffs into the lungs every 6 (six) hours as needed for wheezing. (Needs to be seen before next refill) Patient taking differently: Inhale 2 puffs into the lungs every 6 (six) hours as needed for wheezing.  10/24/18  Yes Mechele Claude, MD  oxyCODONE (OXYCONTIN) 10 mg 12 hr tablet Take 10 mg by mouth 3 (three) times daily.   Yes [provider]  SYMBICORT 160-4.5 MCG/ACT inhaler 2 PUFFS 2 TIMES A DAY FOR COPD/ASTHMA Patient taking differently: Inhale 2 puffs into the lungs 2 (two) times daily.  05/24/18  Yes Mechele Claude, MD  tapentadol (NUCYNTA) 50 MG tablet Take 50 mg by mouth 2 (two) times daily.   Yes [provider]    Family History Family History  Problem  Relation Age of Onset  . Diabetes Mother   . Cancer Father        prostate    Social History Social History   Tobacco Use  . Smoking status: Current Every Day Smoker    Packs/day: 0.50    Types: Cigarettes  . Smokeless tobacco: Never Used  Substance Use Topics  . Alcohol use: No  . Drug use: No     Allergies   Codeine and Other   Review of Systems Review of Systems  Eyes: Positive for visual disturbance.  Respiratory: Positive for shortness of breath.   Cardiovascular: Positive for chest pain.  Gastrointestinal: Positive for nausea and vomiting. Negative for abdominal pain.  Neurological: Positive for numbness and headaches.  All other systems reviewed and are negative.    Physical Exam Updated Vital Signs BP (!) 137/111   Pulse 91   Temp 97.8 F (36.6 C)   Resp 20   Ht  (1.676 m)   Wt 90.7 kg   SpO2 94%   BMI 32.28 kg/m   Physical Exam Vitals signs and nursing note reviewed.  Constitutional:      General: He is in acute distress.     Appearance: He is well-developed.  HENT:     Head: Normocephalic and atraumatic.  Eyes:     General:        Right eye: No discharge.        Left eye: No discharge.     Extraocular Movements: Extraocular movements intact.     Conjunctiva/sclera: Conjunctivae normal.     Pupils: Pupils are equal, round, and reactive to light.  Neck:     Vascular: No JVD.     Trachea: No tracheal deviation.  Cardiovascular:     Rate and Rhythm: Regular rhythm. Tachycardia present.  Pulmonary:     Effort: Pulmonary effort is normal.     Breath sounds: Normal breath sounds.  Abdominal:     General: There is no distension.     Palpations: Abdomen is soft.  Skin:    General: Skin is warm and dry.     Findings: No erythema.  Neurological:     Mental Status: He is alert.     GCS: GCS eye subscore is 4. GCS verbal subscore is 5. GCS motor subscore is 6.     Comments: Fluent speech with no dysarthria or aphasia, no facial droop.   Cranial nerves II through XII tested and intact.  Moves extremities spontaneously without difficulty aside from chronic right foot drop.  Altered sensation to light touch of the left side of the face and left upper extremity.  Dysmetria with finger-to-nose of bilateral upper extremities.  5/5 strength of BUE and BLE major muscle  groups aside from weakness with plantarflexion, dorsiflexion, and EHL of the right lower extremity.  Patient reports this is chronic and unchanged.  No pronator drift.  Psychiatric:        Behavior: Behavior normal.      ED Treatments / Results  Labs (all labs ordered are listed, but only abnormal results are displayed) Labs Reviewed  CBC - Abnormal; Notable for the following components:      Result Value   WBC 12.0 (*)    All other components within normal limits  DIFFERENTIAL - Abnormal; Notable for the following components:   Neutro Abs 7.8 (*)    Monocytes Absolute 1.2 (*)    All other components within normal limits  COMPREHENSIVE METABOLIC PANEL - Abnormal; Notable for the following components:   Glucose, Bld 103 (*)    All other components within normal limits  CSF CULTURE  GRAM STAIN  HSV CULTURE AND TYPING  SARS CORONAVIRUS 2 (TAT 6-24 HRS)  ETHANOL  PROTIME-INR  APTT  RAPID URINE DRUG SCREEN, HOSP PERFORMED  URINALYSIS, ROUTINE W REFLEX MICROSCOPIC  CSF CELL COUNT WITH DIFFERENTIAL  CSF CELL COUNT WITH DIFFERENTIAL  GLUCOSE, CSF  PROTEIN, CSF  TROPONIN I (HIGH SENSITIVITY)  TROPONIN I (HIGH SENSITIVITY)    EKG None  Radiology Ct Angio Chest/abd/pel For Dissection W And/or Wo Contrast  Result Date: 12/04/2018 CLINICAL DATA:  Chest pain EXAM: CT ANGIOGRAPHY CHEST, ABDOMEN AND PELVIS TECHNIQUE: Multidetector CT imaging through the chest, abdomen and pelvis was performed using the standard protocol during bolus administration of intravenous contrast. Multiplanar reconstructed images and MIPs were obtained and reviewed to evaluate the  vascular anatomy. CONTRAST:  OMNIPAQUE IOHEXOL 350 MG/ML SOLN COMPARISON:  Chest x-ray 10/07/2016 FINDINGS: CTA CHEST FINDINGS Cardiovascular: Non contrasted images of the chest demonstrate no acute intramural hematoma. Negative for acute aortic dissection or aneurysm. Normal heart size. No pericardial effusion Mediastinum/Nodes: No enlarged mediastinal, hilar, or axillary lymph nodes. Thyroid gland, trachea, and esophagus demonstrate no significant findings. Lungs/Pleura: Moderate emphysema. No acute consolidation or effusion. 3 mm left upper lobe ground-glass nodule, series 7, image number 80. Musculoskeletal: No acute or suspicious osseous abnormality. Review of the MIP images confirms the above findings. CTA ABDOMEN AND PELVIS FINDINGS VASCULAR Aorta: Normal caliber aorta without aneurysm, dissection, vasculitis or significant stenosis. Mild aortic atherosclerosis. Celiac: Patent without evidence of aneurysm, dissection, vasculitis or significant stenosis. Incomplete opacification of portions of splenic artery. SMA: Patent without evidence of aneurysm, dissection, vasculitis or significant stenosis. Renals: Single right and single left renal arteries are without significant stenosis. IMA: Patent without evidence of aneurysm, dissection, vasculitis or significant stenosis. Inflow: Patent without evidence of aneurysm, dissection, vasculitis or significant stenosis. Review of the MIP images confirms the above findings. NON-VASCULAR Hepatobiliary: No focal liver abnormality is seen. No gallstones, gallbladder wall thickening, or biliary dilatation. Pancreas: Unremarkable. No pancreatic ductal dilatation or surrounding inflammatory changes. Spleen: Normal in size without focal abnormality. Adrenals/Urinary Tract: Adrenal glands are unremarkable. Kidneys are normal, without renal calculi, focal lesion, or hydronephrosis. Bladder is unremarkable. Stomach/Bowel: Stomach is within normal limits. Appendix appears  normal. No evidence of bowel wall thickening, distention, or inflammatory changes. Lymphatic: No significantly enlarged lymph nodes Reproductive: Prostate is unremarkable. Other: Negative for free air or free fluid Musculoskeletal: Post fusion changes at L4 through S1. No acute or suspicious osseous abnormality Review of the MIP images confirms the above findings. IMPRESSION: 1. Negative for acute aortic dissection for aneurysm. 2. Emphysema 3. 3 mm left upper  lobe ground-glass nodule. No follow-up recommended. This recommendation follows the consensus statement: Guidelines for Management of Incidental Pulmonary Nodules Detected on CT Images: From the Fleischner Society 2017; Radiology 2017; 284:228-243. 4. No CT evidence for acute intra-abdominal or pelvic abnormality. Electronically Signed   By: Donavan Foil M.D.   On: 12/04/2018 20:51   Ct Head Code Stroke Wo Contrast`  Result Date: 12/04/2018 CLINICAL DATA:  Code stroke.  Severe headache over the last 3 days. EXAM: CT HEAD WITHOUT CONTRAST TECHNIQUE: Contiguous axial images were obtained from the base of the skull through the vertex without intravenous contrast. COMPARISON:  None. FINDINGS: Brain: Normal appearance without evidence of atrophy, old or acute infarction, mass lesion, hemorrhage, hydrocephalus or extra-axial collection. Vascular: No abnormal vascular finding. Skull: Normal Sinuses/Orbits: Clear/normal Other: None ASPECTS ([place] Stroke Program Early CT Score) - Ganglionic level infarction (caudate, lentiform nuclei, internal capsule, insula, M1-M3 cortex): 7 - Supraganglionic infarction (M4-M6 cortex): 3 Total score (0-10 with 10 being normal): 10 IMPRESSION: 1. Normal head CT 2. ASPECTS is 10 3. These results were called by telephone at the time of interpretation on 12/04/2018 at 8:27 pm to provider [facility] , who verbally acknowledged these results. Electronically Signed   By: Nelson Chimes M.D.   On: 12/04/2018 20:31    Procedures  .Critical Care Performed by: Renita Papa, PA-C Authorized by: Renita Papa, PA-C   Critical care provider statement:    Critical care time (minutes):  45   Critical care was necessary to treat or prevent imminent or life-threatening deterioration of the following conditions:  CNS failure or compromise   Critical care was time spent personally by me on the following activities:  Discussions with consultants, evaluation of patient's response to treatment, examination of patient, ordering and performing treatments and interventions, ordering and review of laboratory studies, ordering and review of radiographic studies, pulse oximetry, re-evaluation of patient's condition, obtaining history from patient or surrogate and review of old charts .Lumbar Puncture  Date/Time: 12/04/2018 11:13 PM Performed by: Renita Papa, PA-C Authorized by: Renita Papa, PA-C   Consent:    Consent obtained:  Verbal   Consent given by:  Patient   Risks discussed:  Bleeding, infection, nerve damage, headache, pain and repeat procedure   Alternatives discussed:  No treatment and delayed treatment Pre-procedure details:    Procedure purpose:  Diagnostic   Preparation: Patient was prepped and draped in usual sterile fashion   Procedure details:    Lumbar space:  L4-L5 interspace   Patient position:  L lateral decubitus   Needle gauge:  20   Needle type:  Spinal needle - Quincke tip   Needle length (in):  3.5   Ultrasound guidance: no     Number of attempts:  2   Fluid appearance:  Clear   Tubes of fluid:  4   Total volume (ml):  8 Post-procedure:    Puncture site:  Direct pressure applied   Patient tolerance of procedure:  Tolerated well, no immediate complications Comments:     Initial attempt performed by myself unsuccessfully; Dr. Kathrynn Humble performed second attempt with patient sitting in the upright position leaning forward with success.   (including critical care time)  Medications Ordered in ED  Medications  iohexol (OMNIPAQUE) 350 MG/ML injection 75 mL (has no administration in time range)  fentaNYL (SUBLIMAZE) injection 100 mcg (100 mcg Intravenous Given 12/04/18 2028)  iohexol (OMNIPAQUE) 350 MG/ML injection 100 mL (100 mLs Intravenous Contrast Given 12/04/18 2031)  sodium chloride 0.9 % bolus 1,000 mL (1,000 mLs Intravenous New Bag/Given 12/04/18 2201)  HYDROmorphone (DILAUDID) injection 1 mg (1 mg Intravenous Given 12/04/18 2159)  sodium chloride 0.9 % bolus 1,000 mL (1,000 mLs Intravenous New Bag/Given 12/04/18 2258)  lidocaine (PF) (XYLOCAINE) 1 % injection 30 mL (30 mLs Infiltration Given by Other 12/04/18 2246)  HYDROmorphone (DILAUDID) injection 1 mg (1 mg Intravenous Given 12/04/18 2258)     Initial Impression / Assessment and Plan / ED Course  I have reviewed the triage vital signs and the nursing notes.  Pertinent labs & imaging results that were available during my care of the patient were reviewed by me and considered in my medical decision making (see chart for details).       Patient presenting for evaluation of sudden onset left-sided headache and left-sided chest pains beginning at 5 PM today.  He is afebrile, initially tachycardic and hypertensive, appears significantly uncomfortable.  Due to left-sided sensory changes in vision loss in the setting of severe headache a code stroke was initiated.  Teleneurology was consulted.  Noncontrast head CT shows no acute abnormalities.  Teleneurology recommends obtaining LP in the ED, CTA head and neck, admission for MRI and echocardiogram.  Given history of chest pain with neurologic findings a dissection study was also obtained which shows no evidence of dissection.  EKG shows sinus tachycardia, Q waves in the inferior leads which have been present previously.  Serial troponins are negative.  No evidence of MI at this time.  Doubt PE, cardiac tamponade, or esophageal rupture.  Lab work thus far reviewed by me shows mild  nonspecific leukocytosis, no anemia, no metabolic derangements.  No renal insufficiency.  Lumbar puncture was attempted by myself unsuccessfully, second attempt by Dr. Rhunette Croft  was successful.  Plan for admission to hospitalist service.  Dr. Rhunette Croft to speak with them to arrange admission.  Headache has improved with Dilaudid and fluids.  Final Clinical Impressions(s) / ED Diagnoses   Final diagnoses:  Sudden onset of severe headache  Left-sided chest pain    ED Discharge Orders    None       Jeanie Sewer, PA-C 12/04/18 2320    Derwood Kaplan, MD 12/13/18 1644

## 2018-12-04 NOTE — ED Triage Notes (Signed)
Pt C/O blurred vision on the left eye, headache that has got worse tonight around 1700, and left sided chest pain. Son states he noticed the right side of pts face is dropping "slightly to the right." Son also states that he noticed the pt drooling from the right side of the mouth and pt does not realize it.

## 2018-12-05 LAB — CSF CELL COUNT WITH DIFFERENTIAL
RBC Count, CSF: 7 /mm3 — ABNORMAL HIGH
RBC Count, CSF: 1 /mm3 — ABNORMAL HIGH
Tube #: 1
Tube #: 4
WBC, CSF: 4 /mm3 (ref 0–5)
WBC, CSF: 1 /mm3 (ref 0–5)

## 2018-12-05 LAB — SARS CORONAVIRUS 2 (TAT 6-24 HRS): SARS Coronavirus 2: NEGATIVE

## 2018-12-05 LAB — URINALYSIS, ROUTINE W REFLEX MICROSCOPIC
Bacteria, UA: NONE SEEN
Bilirubin Urine: NEGATIVE
Glucose, UA: NEGATIVE mg/dL
Ketones, ur: NEGATIVE mg/dL
Leukocytes,Ua: NEGATIVE
Nitrite: NEGATIVE
Protein, ur: NEGATIVE mg/dL
Specific Gravity, Urine: >1.046 — ABNORMAL HIGH (ref 1.005–1.030)
pH: 5 (ref 5.0–8.0)

## 2018-12-05 LAB — RAPID URINE DRUG SCREEN, HOSP PERFORMED
Amphetamines: NOT DETECTED
Barbiturates: NOT DETECTED
Benzodiazepines: NOT DETECTED
Cocaine: NOT DETECTED
Opiates: POSITIVE — AB
Tetrahydrocannabinol: NOT DETECTED

## 2018-12-05 LAB — GRAM STAIN: Special Requests: NORMAL

## 2018-12-05 LAB — GLUCOSE, CSF: Glucose, CSF: 67 mg/dL (ref 40–70)

## 2018-12-05 LAB — HIV ANTIBODY (ROUTINE TESTING W REFLEX): HIV Screen 4th Generation wRfx: NONREACTIVE

## 2018-12-05 LAB — HEMOGLOBIN A1C
Hgb A1c MFr Bld: 5.4 % (ref 4.8–5.6)
Mean Plasma Glucose: 108.28 mg/dL

## 2018-12-05 LAB — PROTEIN, CSF: Total  Protein, CSF: 52 mg/dL — ABNORMAL HIGH (ref 15–45)

## 2018-12-05 MED ORDER — ACETAMINOPHEN 325 MG PO TABS: 650.0000 mg | ORAL_TABLET | ORAL | Status: DC | PRN

## 2018-12-05 MED ORDER — IPRATROPIUM-ALBUTEROL 0.5-2.5 (3) MG/3ML IN SOLN: 3.0000 mL | Freq: Four times a day (QID) | RESPIRATORY_TRACT | Status: DC

## 2018-12-05 MED ORDER — ACETAMINOPHEN 650 MG RE SUPP: 650.0000 mg | RECTAL | Status: DC | PRN

## 2018-12-05 MED ORDER — ASPIRIN 325 MG PO TABS: 325.0000 mg | ORAL_TABLET | Freq: Every day | ORAL | Status: DC

## 2018-12-05 MED ORDER — MOMETASONE FURO-FORMOTEROL FUM 200-5 MCG/ACT IN AERO: 2.0000 | INHALATION_SPRAY | Freq: Two times a day (BID) | RESPIRATORY_TRACT | Status: DC

## 2018-12-05 MED ORDER — ASPIRIN 300 MG RE SUPP: 300.0000 mg | Freq: Every day | RECTAL | Status: DC

## 2018-12-05 MED ORDER — STROKE: EARLY STAGES OF RECOVERY BOOK
Freq: Once | Status: DC
  Filled 2018-12-05: qty 1

## 2018-12-05 MED ORDER — ACETAMINOPHEN 160 MG/5ML PO SOLN: 650.0000 mg | ORAL | Status: DC | PRN

## 2018-12-05 MED ORDER — CLONIDINE HCL 0.2 MG/24HR TD PTWK
0.2000 mg | MEDICATED_PATCH | TRANSDERMAL | Status: DC
  Filled 2018-12-05: qty 1

## 2018-12-05 MED ORDER — SODIUM CHLORIDE 0.9 % IV SOLN: INTRAVENOUS | Status: DC

## 2018-12-05 NOTE — ED Notes (Signed)
This RN was going to check on pt & realized the pt was not in treatment room, pt found in parking lot, pt states we "treated me like shit," IV dc'd, risks of leaving explained to pt, pt agreed to leave. Dr. Darrick Meigs made aware of situation.

## 2018-12-05 NOTE — Discharge Summary (Signed)
Left AMA  Patient was earlier admitted with headache and left-sided numbness, underwent CT head which was negative for stroke.  LP was done to rule out meningitis.  Tele neurology recommended admission for stroke work-up. Patient was monitored for stroke work-up.  RN called me and told me that patient was not in his room and found in parking lot.  Left AMA risk of leaving were explained to patient and he agreed to leave.

## 2018-12-08 LAB — CSF CULTURE W GRAM STAIN
Culture: NO GROWTH
Gram Stain: NONE SEEN
Special Requests: NORMAL

## 2018-12-08 LAB — HSV CULTURE AND TYPING

## 2019-11-22 IMAGING — CT CT ANGIO NECK
2 of 12 series · 6 of 35 positions shown · IV contrast (omnipaque)
Comparison: Prior head CT from earlier the same day.

CLINICAL DATA: Initial evaluation for left-sided blurry vision,
headache.

EXAM:
CT ANGIOGRAPHY HEAD AND NECK
TECHNIQUE: Multidetector CT imaging of the head and neck was performed using
the standard protocol during bolus administration of intravenous
contrast. Multiplanar CT image reconstructions and MIPs were
obtained to evaluate the vascular anatomy. Carotid stenosis
measurements (when applicable) are obtained utilizing NASCET
criteria, using the distal internal carotid diameter as the
denominator.
CONTRAST:  75mL OMNIPAQUE IOHEXOL 350 MG/ML SOLN

[Series 9: cta head & neck · axial · 0.39mm/px · z∈[-130,+78]mm · 4 of 697 slices shown]
[im 140/697  soft-tissue]
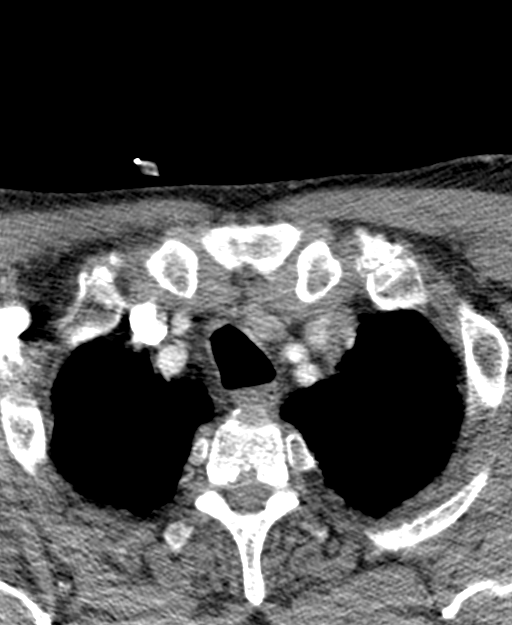
[im 279/697  bone]
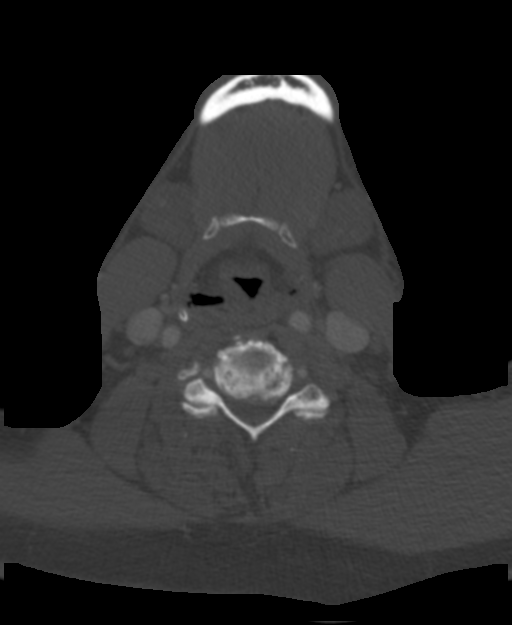
[im 418/697  soft-tissue]
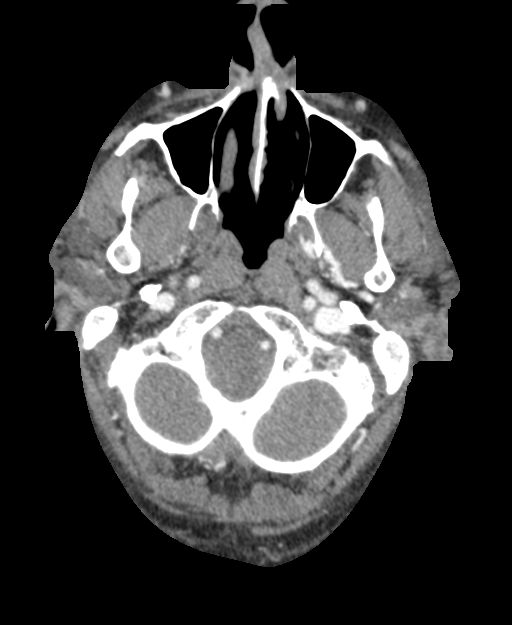
[im 557/697  bone]
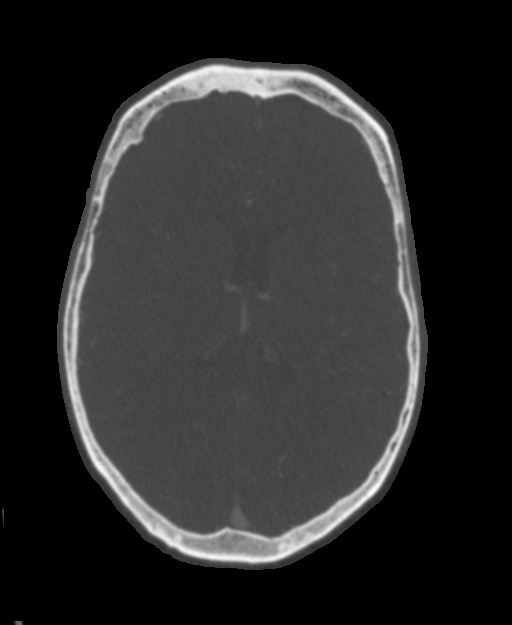

[Series 10: ax thins · axial · 0.39mm/px · z∈[-84,+32]mm · 2 of 349 slices shown]
[im 117/349  soft-tissue]
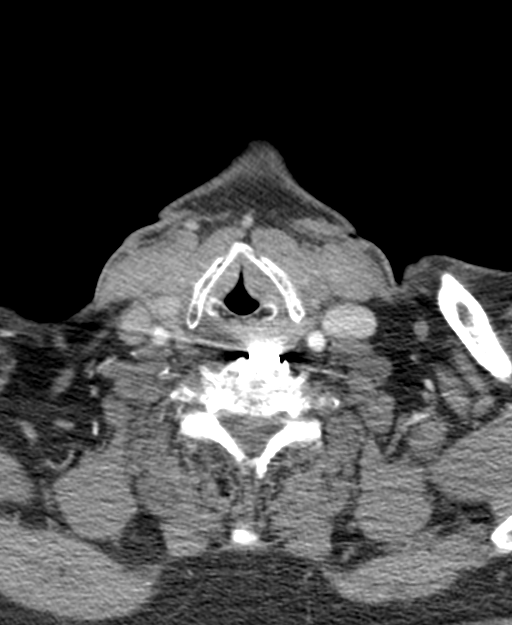
[im 233/349  soft-tissue]
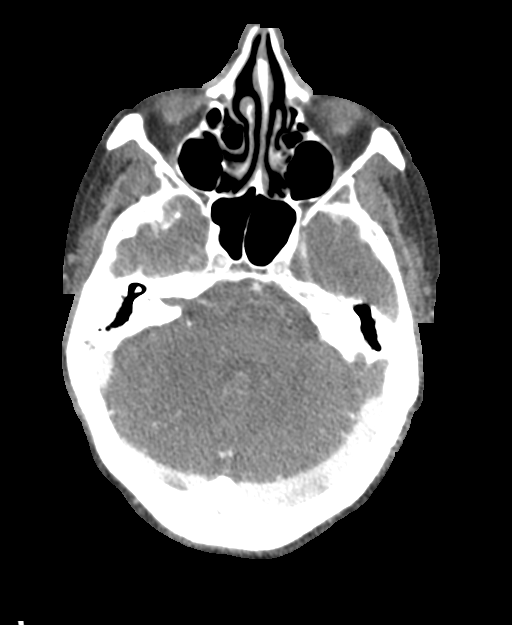

[6 of 35 positions shown; findings below may reference images not displayed]

FINDINGS: CT HEAD FINDINGS

Brain: Cerebral volume within normal limits for patient age.

No evidence for acute intracranial hemorrhage. No findings to
suggest acute large vessel territory infarct. No mass lesion,
midline shift, or mass effect. Ventricles are normal in size without
evidence for hydrocephalus. No extra-axial fluid collection
identified.

Vascular: No hyperdense vessel identified.Scattered vascular
calcifications noted within the carotid siphons.

Skull: Scalp soft tissues demonstrate no acute abnormality.
Calvarium intact.

Sinuses/Orbits: Globes and orbital soft tissues within normal
limits.

Visualized paranasal sinuses are clear. No mastoid effusion.

CTA NECK FINDINGS

Aortic arch: Visualized aortic arch of normal caliber with normal 3
vessel morphology. No hemodynamically significant stenosis seen
about the origin of the great vessels. Visualized subclavian
arteries widely patent.

Right carotid system: Right common and internal carotid arteries
widely patent without stenosis, dissection, or occlusion. Minor
atheromatous plaque about the right bifurcation without significant
stenosis.

Left carotid system: Left common and internal carotid arteries
widely patent without stenosis, dissection or occlusion. Minor
atheromatous change about the left bifurcation without significant
stenosis.

Vertebral arteries: Both vertebral arteries arise from the
subclavian arteries. Mild atherosclerotic change about the origin of
the vertebral arteries without significant stenosis. Vertebral
arteries widely patent within the neck without stenosis, dissection
or occlusion.

Skeleton: No acute osseous abnormality. No discrete lytic or blastic
osseous lesions. Patient status post ACDF at C5-C7.

Other neck: No other acute soft tissue abnormality within the neck.
No mass lesion or adenopathy.

Upper chest: Visualized upper chest demonstrates no acute finding.
Emphysematous changes noted.

Review of the MIP images confirms the above findings

CTA HEAD FINDINGS

Anterior circulation: Petrous segments widely patent bilaterally.
Minor atheromatous change within the carotid siphons without
hemodynamically significant stenosis. ICA termini well perfused. 4
mm irregular outpouching extending superiorly from the right ICA
terminus suspicious for a small aneurysm (series 11, image 87). A1
segments patent bilaterally. Normal anterior communicating artery
complex. Anterior cerebral arteries widely patent to their distal
aspects. No M1 stenosis or occlusion. Negative MCA bifurcations.
Distal MCA branches well perfused and symmetric.

Posterior circulation: Left vertebral artery widely patent to the
vertebrobasilar junction without stenosis. Minimal non stenotic
plaque within the proximal right V4 segment without stenosis.
Posterior inferior cerebral arteries not well seen. Basilar widely
patent to its distal aspect without stenosis. Superior cerebral
arteries patent bilaterally. Left PCA supplied via the basilar.
Right PCA supplied via the basilar as well as a small right
posterior communicating artery. PCAs well perfused to their distal
aspects without stenosis.

Venous sinuses: Patent.

Anatomic variants: None significant.

Review of the MIP images confirms the above findings
IMPRESSION: 1. Negative CTA for large vessel occlusion.
2. Mild atherosclerotic change about the carotid bifurcations and
carotid siphons without hemodynamically significant stenosis. No
proximal high-grade or correctable stenosis seen within the major
arterial vasculature of the head and neck.
3. 4 mm focal outpouching arising from the right ICA terminus,
suspicious for aneurysm.
4.  Emphysema ([id number]).

## 2019-11-23 IMAGING — CR DG CHEST 1V PORT
1 series · 1 of 1 positions shown · non-contrast
Comparison: CT angiography of the chest 4 hours ago.

CLINICAL DATA: Chest pain.

EXAM:
PORTABLE CHEST 1 VIEW

[ap portable]
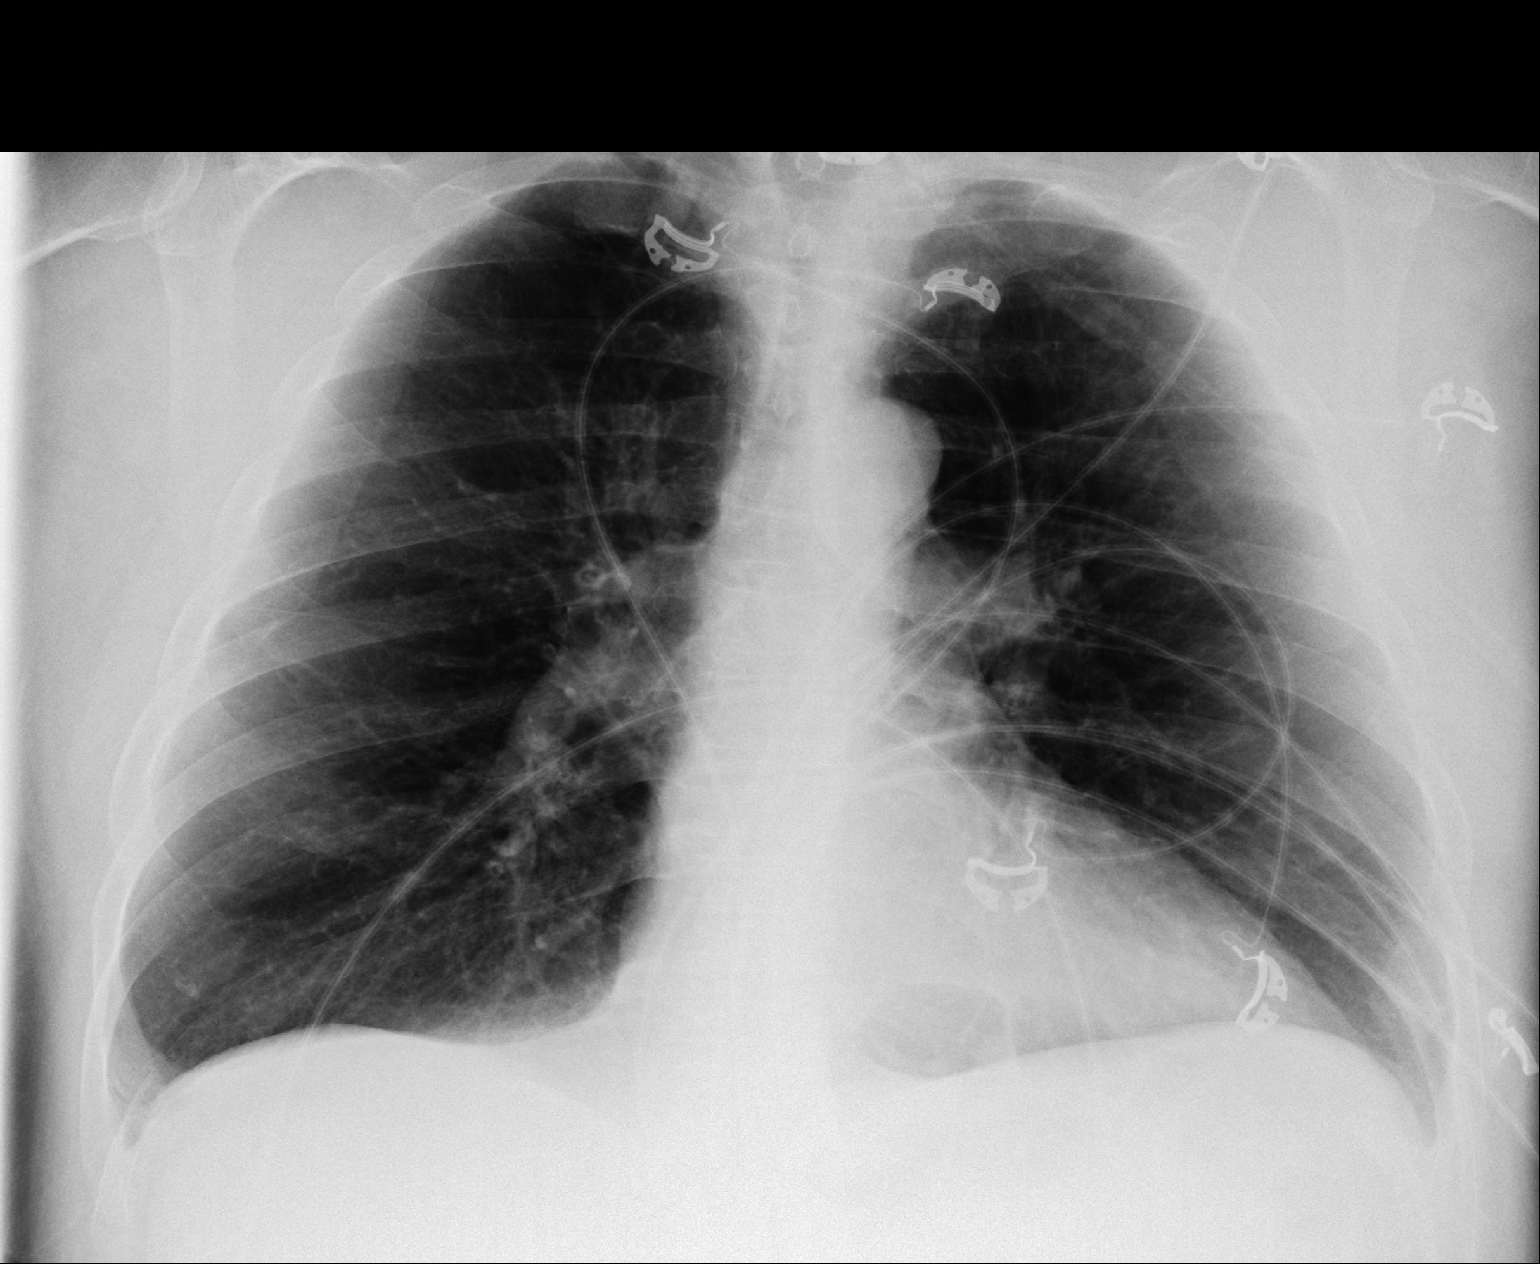

[1 of 1 positions shown; findings below may reference images not displayed]

FINDINGS: Emphysema.The cardiomediastinal contours are normal. Pulmonary
vasculature is normal. No consolidation, pleural effusion, or
pneumothorax. No acute osseous abnormalities are seen.
IMPRESSION: Emphysema without acute abnormality.

## 2020-01-15 DEATH — deceased
